# Patient Record
Sex: Female | Born: 1965
Health system: Southern US, Community
[De-identification: ages and names within clinical notes are randomized; demographics above are authoritative.]

## PROBLEM LIST (undated history)

## (undated) DIAGNOSIS — E039 Hypothyroidism, unspecified: Secondary | ICD-10-CM

## (undated) DIAGNOSIS — Z1151 Encounter for screening for human papillomavirus (HPV): Secondary | ICD-10-CM

## (undated) DIAGNOSIS — Z6832 Body mass index (BMI) 32.0-32.9, adult: Secondary | ICD-10-CM

## (undated) DIAGNOSIS — R4586 Emotional lability: Principal | ICD-10-CM

## (undated) DIAGNOSIS — Z1283 Encounter for screening for malignant neoplasm of skin: Secondary | ICD-10-CM

## (undated) DIAGNOSIS — E7800 Pure hypercholesterolemia, unspecified: Secondary | ICD-10-CM

---

## 2015-05-25 DIAGNOSIS — C50919 Malignant neoplasm of unspecified site of unspecified female breast: Secondary | ICD-10-CM

## 2015-05-25 HISTORY — PX: BREAST LUMPECTOMY: SHX2

## 2015-05-25 HISTORY — DX: Malignant neoplasm of unspecified site of unspecified female breast: C50.919

## 2019-05-15 DIAGNOSIS — U071 COVID-19: Secondary | ICD-10-CM | POA: Diagnosis not present

## 2019-05-23 ENCOUNTER — Emergency Department (HOSPITAL_COMMUNITY): Payer: BC Managed Care – PPO

## 2019-05-23 ENCOUNTER — Encounter (HOSPITAL_COMMUNITY): Payer: Self-pay | Admitting: Emergency Medicine

## 2019-05-23 ENCOUNTER — Inpatient Hospital Stay (HOSPITAL_COMMUNITY)
Admission: EM | Admit: 2019-05-23 | Discharge: 2019-05-30 | DRG: 177 | Disposition: A | Discharge status: Home / Self Care | Payer: BC Managed Care – PPO | Attending: Internal Medicine | Admitting: Internal Medicine

## 2019-05-23 ENCOUNTER — Other Ambulatory Visit: Payer: Self-pay

## 2019-05-23 DIAGNOSIS — F329 Major depressive disorder, single episode, unspecified: Secondary | ICD-10-CM | POA: Diagnosis present

## 2019-05-23 DIAGNOSIS — J1289 Other viral pneumonia: Secondary | ICD-10-CM | POA: Diagnosis not present

## 2019-05-23 DIAGNOSIS — U071 COVID-19: Secondary | ICD-10-CM | POA: Diagnosis not present

## 2019-05-23 DIAGNOSIS — E039 Hypothyroidism, unspecified: Secondary | ICD-10-CM | POA: Diagnosis present

## 2019-05-23 DIAGNOSIS — J1282 Pneumonia due to coronavirus disease 2019: Secondary | ICD-10-CM

## 2019-05-23 DIAGNOSIS — Z923 Personal history of irradiation: Secondary | ICD-10-CM

## 2019-05-23 DIAGNOSIS — J9600 Acute respiratory failure, unspecified whether with hypoxia or hypercapnia: Secondary | ICD-10-CM | POA: Diagnosis not present

## 2019-05-23 DIAGNOSIS — M5489 Other dorsalgia: Secondary | ICD-10-CM | POA: Diagnosis not present

## 2019-05-23 DIAGNOSIS — Z880 Allergy status to penicillin: Secondary | ICD-10-CM | POA: Diagnosis not present

## 2019-05-23 DIAGNOSIS — J984 Other disorders of lung: Secondary | ICD-10-CM | POA: Diagnosis not present

## 2019-05-23 DIAGNOSIS — N393 Stress incontinence (female) (male): Secondary | ICD-10-CM | POA: Diagnosis present

## 2019-05-23 DIAGNOSIS — R0602 Shortness of breath: Secondary | ICD-10-CM | POA: Diagnosis not present

## 2019-05-23 DIAGNOSIS — Z6827 Body mass index (BMI) 27.0-27.9, adult: Secondary | ICD-10-CM | POA: Diagnosis not present

## 2019-05-23 DIAGNOSIS — D649 Anemia, unspecified: Secondary | ICD-10-CM | POA: Diagnosis present

## 2019-05-23 DIAGNOSIS — Z882 Allergy status to sulfonamides status: Secondary | ICD-10-CM | POA: Diagnosis not present

## 2019-05-23 DIAGNOSIS — J9601 Acute respiratory failure with hypoxia: Secondary | ICD-10-CM | POA: Diagnosis not present

## 2019-05-23 DIAGNOSIS — Z853 Personal history of malignant neoplasm of breast: Secondary | ICD-10-CM

## 2019-05-23 DIAGNOSIS — Z79810 Long term (current) use of selective estrogen receptor modulators (SERMs): Secondary | ICD-10-CM | POA: Diagnosis not present

## 2019-05-23 DIAGNOSIS — R52 Pain, unspecified: Secondary | ICD-10-CM | POA: Diagnosis not present

## 2019-05-23 DIAGNOSIS — E669 Obesity, unspecified: Secondary | ICD-10-CM | POA: Diagnosis not present

## 2019-05-23 DIAGNOSIS — R0902 Hypoxemia: Secondary | ICD-10-CM | POA: Diagnosis not present

## 2019-05-23 DIAGNOSIS — E785 Hyperlipidemia, unspecified: Secondary | ICD-10-CM | POA: Diagnosis present

## 2019-05-23 DIAGNOSIS — Z79899 Other long term (current) drug therapy: Secondary | ICD-10-CM

## 2019-05-23 LAB — COMPREHENSIVE METABOLIC PANEL
ALT: 24 U/L (ref 0–44)
AST: 28 U/L (ref 15–41)
Albumin: 2.7 g/dL — ABNORMAL LOW (ref 3.5–5.0)
Alkaline Phosphatase: 54 U/L (ref 38–126)
Anion gap: 8 (ref 5–15)
BUN: 11 mg/dL (ref 6–20)
CO2: 27 mmol/L (ref 22–32)
Calcium: 8.6 mg/dL — ABNORMAL LOW (ref 8.9–10.3)
Chloride: 107 mmol/L (ref 98–111)
Creatinine, Ser: 0.79 mg/dL (ref 0.44–1.00)
GFR calc Af Amer: >60 mL/min (ref >60)
GFR calc non Af Amer: >60 mL/min (ref >60)
Glucose, Bld: 131 mg/dL — ABNORMAL HIGH (ref 70–99)
Potassium: 3.2 mmol/L — ABNORMAL LOW (ref 3.5–5.1)
Sodium: 142 mmol/L (ref 135–145)
Total Bilirubin: 0.3 mg/dL (ref 0.3–1.2)
Total Protein: 5.7 g/dL — ABNORMAL LOW (ref 6.5–8.1)

## 2019-05-23 LAB — CBC WITH DIFFERENTIAL/PLATELET
Abs Immature Granulocytes: 0.03 10*3/uL (ref 0.00–0.07)
Basophils Absolute: 0 10*3/uL (ref 0.0–0.1)
Basophils Relative: 0 %
Eosinophils Absolute: 0 10*3/uL (ref 0.0–0.5)
Eosinophils Relative: 0 %
HCT: 36.3 % (ref 36.0–46.0)
Hemoglobin: 11.9 g/dL — ABNORMAL LOW (ref 12.0–15.0)
Immature Granulocytes: 1 %
Lymphocytes Relative: 24 %
Lymphs Abs: 1.2 10*3/uL (ref 0.7–4.0)
MCH: 29.3 pg (ref 26.0–34.0)
MCHC: 32.8 g/dL (ref 30.0–36.0)
MCV: 89.4 fL (ref 80.0–100.0)
Monocytes Absolute: 0.2 10*3/uL (ref 0.1–1.0)
Monocytes Relative: 3 %
Neutro Abs: 3.6 10*3/uL (ref 1.7–7.7)
Neutrophils Relative %: 72 %
Platelets: 226 10*3/uL (ref 150–400)
RBC: 4.06 MIL/uL (ref 3.87–5.11)
RDW: 12.2 % (ref 11.5–15.5)
WBC: 5 10*3/uL (ref 4.0–10.5)
nRBC: 0 % (ref 0.0–0.2)

## 2019-05-23 LAB — C-REACTIVE PROTEIN: CRP: 18.6 mg/dL — ABNORMAL HIGH (ref <1.0)

## 2019-05-23 LAB — FERRITIN: Ferritin: 332 ng/mL — ABNORMAL HIGH (ref 11–307)

## 2019-05-23 LAB — LACTIC ACID, PLASMA: Lactic Acid, Venous: 1.3 mmol/L (ref 0.5–1.9)

## 2019-05-23 LAB — TRIGLYCERIDES: Triglycerides: 239 mg/dL — ABNORMAL HIGH (ref <150)

## 2019-05-23 LAB — FIBRINOGEN: Fibrinogen: 617 mg/dL — ABNORMAL HIGH (ref 210–475)

## 2019-05-23 LAB — D-DIMER, QUANTITATIVE: D-Dimer, Quant: 0.56 ug/mL-FEU — ABNORMAL HIGH (ref 0.00–0.50)

## 2019-05-23 LAB — LACTATE DEHYDROGENASE: LDH: 236 U/L — ABNORMAL HIGH (ref 98–192)

## 2019-05-23 MED ORDER — DEXAMETHASONE SODIUM PHOSPHATE 10 MG/ML IJ SOLN
6.0000 mg | Freq: Once | INTRAMUSCULAR | Status: AC
  Administered 2019-05-23: 6 mg via INTRAVENOUS
  Filled 2019-05-23: qty 1

## 2019-05-23 NOTE — ED Triage Notes (Signed)
Pt presents to ED from home BIB [facility]. Pt c/o being COVID pos 05/15/19 and no improvement of symptoms. Pt states she is still febrile and SOB with no exertion. Pt took 1g tylenol @1800 . Per EMS pt was 86% on RA upon arrival. O2 on 96% on 4L, 87% on RA. Pt in no resp distress during triage.

## 2019-05-23 NOTE — ED Notes (Signed)
Per Hal Hope, MD pt does not need repeat COVID swab. Positive COVID in Care Everywhere chart.

## 2019-05-23 NOTE — ED Provider Notes (Signed)
[facility] EMERGENCY DEPARTMENT Provider Note   CSN: [id number] Arrival date & time: 05/23/19  2130     History Chief Complaint  Patient presents with  . COVID  . Shortness of Breath    Nicole Stephens is a 53 y.o. female.  Patient presents to the emergency department with complaint of shortness of breath and hypoxia in setting of positive coronavirus testing.  Patient is on day 8 of her illness.  Initially she had body aches and fevers.  She was tested by her primary care doctor in [facility] and reports having a positive rapid test ([employer] 12/22).  She states that over the past 2 days her breathing has become worse and she gets short of breath with talking and with exertion.  EMS was called tonight after home pulse oximetry showed levels into the 70s.  Patient states that she continues to have fevers at home.  Using over-the-counter medications such as Tylenol.  Per EMS, patient was 86 on room air improved to 96% on 4 L.  Upon arrival to the emergency department she was 87% on a room air trial.  Patient denies any history of lung disease such as COPD, asthma, bronchitis.  She is not a smoker.  She does not know where she could have contracted coronavirus.  Family members at home have not yet developed any symptoms.  Onset of symptoms acute.  Course is worsening.          Past Medical History:  Diagnosis Date  . Breast cancer ([place]) 2017    There are no problems to display for this patient.   Past Surgical History:  Procedure Laterality Date  . BREAST LUMPECTOMY Right 2017     OB History   No obstetric history on file.     History reviewed. No pertinent family history.  Social History   Tobacco Use  . Smoking status: Never Smoker  . Smokeless tobacco: Never Used  Substance Use Topics  . Alcohol use: Never  . Drug use: Not on file    Home Medications Prior to Admission medications   Not on File    Allergies      Penicillins and Sulfa antibiotics  Review of Systems   Review of Systems  Constitutional: Positive for chills, fatigue and fever.  HENT: Negative for rhinorrhea and sore throat.   Eyes: Negative for redness.  Respiratory: Positive for cough and shortness of breath.   Cardiovascular: Negative for chest pain.  Gastrointestinal: Negative for abdominal pain, diarrhea, nausea and vomiting.  Genitourinary: Negative for dysuria.  Musculoskeletal: Positive for myalgias.  Skin: Negative for rash.  Neurological: Positive for weakness. Negative for headaches.    Physical Exam Updated Vital Signs BP 119/71   Pulse 76   Temp 99.1 F (37.3 C) (Oral)   Resp 18   Ht 5\' 10"  (1.778 m)   Wt 86.2 kg   SpO2 96%   BMI 27.26 kg/m   Physical Exam Vitals and nursing note reviewed.  Constitutional:      Appearance: She is well-developed.  HENT:     Head: Normocephalic and atraumatic.  Eyes:     General:        Right eye: No discharge.        Left eye: No discharge.     Conjunctiva/sclera: Conjunctivae normal.  Cardiovascular:     Rate and Rhythm: Normal rate and regular rhythm.     Heart sounds: Normal heart sounds.  Pulmonary:  Effort: Pulmonary effort is normal. Tachypnea present. No accessory muscle usage.     Breath sounds: Normal breath sounds. No decreased breath sounds, wheezing or rhonchi.  Abdominal:     Palpations: Abdomen is soft.     Tenderness: There is no abdominal tenderness.  Musculoskeletal:     Cervical back: Normal range of motion and neck supple.     Right lower leg: No tenderness. No edema.     Left lower leg: No tenderness. No edema.  Skin:    General: Skin is warm and dry.  Neurological:     Mental Status: She is alert.     ED Results / Procedures / Treatments   Labs (all labs ordered are listed, but only abnormal results are displayed) Labs Reviewed  CBC WITH DIFFERENTIAL/PLATELET - Abnormal; Notable for the following components:      Result Value    Hemoglobin 11.9 (*)    All other components within normal limits  COMPREHENSIVE METABOLIC PANEL - Abnormal; Notable for the following components:   Potassium 3.2 (*)    Glucose, Bld 131 (*)    Calcium 8.6 (*)    Total Protein 5.7 (*)    Albumin 2.7 (*)    All other components within normal limits  D-DIMER, QUANTITATIVE (NOT AT [facility]) - Abnormal; Notable for the following components:   D-Dimer, Quant 0.56 (*)    All other components within normal limits  LACTATE DEHYDROGENASE - Abnormal; Notable for the following components:   LDH 236 (*)    All other components within normal limits  FERRITIN - Abnormal; Notable for the following components:   Ferritin 332 (*)    All other components within normal limits  TRIGLYCERIDES - Abnormal; Notable for the following components:   Triglycerides 239 (*)    All other components within normal limits  FIBRINOGEN - Abnormal; Notable for the following components:   Fibrinogen 617 (*)    All other components within normal limits  C-REACTIVE PROTEIN - Abnormal; Notable for the following components:   CRP 18.6 (*)    All other components within normal limits  CULTURE, BLOOD (ROUTINE X 2)  CULTURE, BLOOD (ROUTINE X 2)  LACTIC ACID, PLASMA  LACTIC ACID, PLASMA  PROCALCITONIN    EKG EKG Interpretation  Date/Time:  Wednesday May 23 2019 21:33:23 EST Ventricular Rate:  75 PR Interval:    QRS Duration: 90 QT Interval:  391 QTC Calculation: 437 R Axis:   72 Text Interpretation: Sinus rhythm Borderline T wave abnormalities no prior available for comparison Confirmed by Quintella Reichert [phone]) on 05/23/2019 9:44:22 PM   Radiology DG Chest Port 1 View  Result Date: 05/23/2019 CLINICAL DATA:  Hypoxia.  COVID-19 positive. EXAM: PORTABLE CHEST 1 VIEW COMPARISON:  None. FINDINGS: Patchy bilateral airspace disease. Upper normal heart size with normal mediastinal contours. No pleural fluid or pneumothorax. No acute osseous abnormalities.  IMPRESSION: Patchy bilateral airspace disease, consistent with pneumonia, pattern typical of COVID-19. Electronically Signed   By: Keith Rake M.D.   On: 05/23/2019 23:29    Procedures Procedures (including critical care time)  Medications Ordered in ED Medications - No data to display  ED Course  I have reviewed the triage vital signs and the nursing notes.  Pertinent labs & imaging results that were available during my care of the patient were reviewed by me and considered in my medical decision making (see chart for details).  Patient seen and examined. Work-up initiated.  Patient will need admitted for respiratory failure  in setting of Covid pneumonia.  Will begin treatment with steroids.  Vital signs reviewed and are as follows: BP 119/71   Pulse 76   Temp 99.1 F (37.3 C) (Oral)   Resp 18   Ht 5\' 10"  (1.778 m)   Wt 86.2 kg   SpO2 96%   BMI 27.26 kg/m   11:09 PM X-ray images personally reviewed and interpreted.    Will call for admit.   Nicole Stephens was evaluated in Emergency Department on 05/23/2019 for the symptoms described in the history of present illness. She was evaluated in the context of the global COVID-19 pandemic, which necessitated consideration that the patient might be at risk for infection with the SARS-CoV-2 virus that causes COVID-19. Institutional protocols and algorithms that pertain to the evaluation of patients at risk for COVID-19 are in a state of rapid change based on information released by regulatory bodies including the CDC and federal and state organizations. These policies and algorithms were followed during the patient's care in the ED.  11:12 PM Spoke with Dr. Hal Hope who will see. No additional COVID test as + test in Care Everywhere.   CRITICAL CARE Performed by: Carlisle Cater PA-C Total critical care time: 30 minutes Critical care time was exclusive of separately billable procedures and treating other patients. Critical care was  necessary to treat or prevent imminent or life-threatening deterioration. Critical care was time spent personally by me on the following activities: development of treatment plan with patient and/or surrogate as well as nursing, discussions with consultants, evaluation of patient's response to treatment, examination of patient, obtaining history from patient or surrogate, ordering and performing treatments and interventions, ordering and review of laboratory studies, ordering and review of radiographic studies, pulse oximetry and re-evaluation of patient's condition.    MDM Rules/Calculators/A&P                      Admit for respiratory failure in setting of COVID-19 PNA, currently requiring 4L O2 [place].   Final Clinical Impression(s) / ED Diagnoses Final diagnoses:  Pneumonia due to COVID-19 virus  Acute respiratory failure with hypoxia [facility])    Rx / DC Orders ED Discharge Orders    None       Carlisle Cater, Hershal Coria 05/23/19 2333    Quintella Reichert, MD 05/24/19 [phone]

## 2019-05-24 ENCOUNTER — Other Ambulatory Visit: Payer: Self-pay

## 2019-05-24 ENCOUNTER — Encounter (HOSPITAL_COMMUNITY): Payer: Self-pay | Admitting: Internal Medicine

## 2019-05-24 DIAGNOSIS — Z882 Allergy status to sulfonamides status: Secondary | ICD-10-CM | POA: Diagnosis not present

## 2019-05-24 DIAGNOSIS — F329 Major depressive disorder, single episode, unspecified: Secondary | ICD-10-CM | POA: Diagnosis present

## 2019-05-24 DIAGNOSIS — Z880 Allergy status to penicillin: Secondary | ICD-10-CM | POA: Diagnosis not present

## 2019-05-24 DIAGNOSIS — N393 Stress incontinence (female) (male): Secondary | ICD-10-CM | POA: Diagnosis present

## 2019-05-24 DIAGNOSIS — J9601 Acute respiratory failure with hypoxia: Secondary | ICD-10-CM | POA: Diagnosis present

## 2019-05-24 DIAGNOSIS — U071 COVID-19: Principal | ICD-10-CM

## 2019-05-24 DIAGNOSIS — J1282 Pneumonia due to coronavirus disease 2019: Secondary | ICD-10-CM | POA: Diagnosis present

## 2019-05-24 DIAGNOSIS — E039 Hypothyroidism, unspecified: Secondary | ICD-10-CM | POA: Diagnosis present

## 2019-05-24 DIAGNOSIS — J9600 Acute respiratory failure, unspecified whether with hypoxia or hypercapnia: Secondary | ICD-10-CM | POA: Diagnosis not present

## 2019-05-24 DIAGNOSIS — J1289 Other viral pneumonia: Secondary | ICD-10-CM

## 2019-05-24 DIAGNOSIS — Z923 Personal history of irradiation: Secondary | ICD-10-CM | POA: Diagnosis not present

## 2019-05-24 DIAGNOSIS — Z853 Personal history of malignant neoplasm of breast: Secondary | ICD-10-CM | POA: Diagnosis not present

## 2019-05-24 DIAGNOSIS — E669 Obesity, unspecified: Secondary | ICD-10-CM | POA: Diagnosis present

## 2019-05-24 DIAGNOSIS — Z79899 Other long term (current) drug therapy: Secondary | ICD-10-CM | POA: Diagnosis not present

## 2019-05-24 DIAGNOSIS — R0602 Shortness of breath: Secondary | ICD-10-CM | POA: Diagnosis present

## 2019-05-24 DIAGNOSIS — E785 Hyperlipidemia, unspecified: Secondary | ICD-10-CM | POA: Diagnosis present

## 2019-05-24 DIAGNOSIS — Z79810 Long term (current) use of selective estrogen receptor modulators (SERMs): Secondary | ICD-10-CM | POA: Diagnosis not present

## 2019-05-24 DIAGNOSIS — D649 Anemia, unspecified: Secondary | ICD-10-CM | POA: Diagnosis present

## 2019-05-24 DIAGNOSIS — Z6827 Body mass index (BMI) 27.0-27.9, adult: Secondary | ICD-10-CM | POA: Diagnosis not present

## 2019-05-24 LAB — CBC WITH DIFFERENTIAL/PLATELET
Abs Immature Granulocytes: 0 10*3/uL (ref 0.00–0.07)
Basophils Absolute: 0 10*3/uL (ref 0.0–0.1)
Basophils Relative: 0 %
Eosinophils Absolute: 0 10*3/uL (ref 0.0–0.5)
Eosinophils Relative: 0 %
HCT: 36.8 % (ref 36.0–46.0)
Hemoglobin: 12.2 g/dL (ref 12.0–15.0)
Lymphocytes Relative: 6 %
Lymphs Abs: 0.3 10*3/uL — ABNORMAL LOW (ref 0.7–4.0)
MCH: 30 pg (ref 26.0–34.0)
MCHC: 33.2 g/dL (ref 30.0–36.0)
MCV: 90.6 fL (ref 80.0–100.0)
Monocytes Absolute: 0 10*3/uL — ABNORMAL LOW (ref 0.1–1.0)
Monocytes Relative: 0 %
Neutro Abs: 4.3 10*3/uL (ref 1.7–7.7)
Neutrophils Relative %: 94 %
Platelets: 246 10*3/uL (ref 150–400)
RBC: 4.06 MIL/uL (ref 3.87–5.11)
RDW: 12.3 % (ref 11.5–15.5)
WBC: 4.6 10*3/uL (ref 4.0–10.5)
nRBC: 0 % (ref 0.0–0.2)
nRBC: 0 /100 WBC

## 2019-05-24 LAB — C-REACTIVE PROTEIN: CRP: 15.3 mg/dL — ABNORMAL HIGH (ref <1.0)

## 2019-05-24 LAB — COMPREHENSIVE METABOLIC PANEL
ALT: 23 U/L (ref 0–44)
AST: 27 U/L (ref 15–41)
Albumin: 2.8 g/dL — ABNORMAL LOW (ref 3.5–5.0)
Alkaline Phosphatase: 54 U/L (ref 38–126)
Anion gap: 11 (ref 5–15)
BUN: 11 mg/dL (ref 6–20)
CO2: 25 mmol/L (ref 22–32)
Calcium: 8.5 mg/dL — ABNORMAL LOW (ref 8.9–10.3)
Chloride: 106 mmol/L (ref 98–111)
Creatinine, Ser: 0.62 mg/dL (ref 0.44–1.00)
GFR calc Af Amer: >60 mL/min (ref >60)
GFR calc non Af Amer: >60 mL/min (ref >60)
Glucose, Bld: 158 mg/dL — ABNORMAL HIGH (ref 70–99)
Potassium: 4 mmol/L (ref 3.5–5.1)
Sodium: 142 mmol/L (ref 135–145)
Total Bilirubin: <0.1 mg/dL — ABNORMAL LOW (ref 0.3–1.2)
Total Protein: 5.7 g/dL — ABNORMAL LOW (ref 6.5–8.1)

## 2019-05-24 LAB — CBC
HCT: 39.6 % (ref 36.0–46.0)
Hemoglobin: 12.7 g/dL (ref 12.0–15.0)
MCH: 29.4 pg (ref 26.0–34.0)
MCHC: 32.1 g/dL (ref 30.0–36.0)
MCV: 91.7 fL (ref 80.0–100.0)
Platelets: 256 10*3/uL (ref 150–400)
RBC: 4.32 MIL/uL (ref 3.87–5.11)
RDW: 12.3 % (ref 11.5–15.5)
WBC: 5.5 10*3/uL (ref 4.0–10.5)
nRBC: 0 % (ref 0.0–0.2)

## 2019-05-24 LAB — CREATININE, SERUM
Creatinine, Ser: 0.7 mg/dL (ref 0.44–1.00)
GFR calc Af Amer: >60 mL/min (ref >60)
GFR calc non Af Amer: >60 mL/min (ref >60)

## 2019-05-24 LAB — PROCALCITONIN: Procalcitonin: <0.1 ng/mL

## 2019-05-24 LAB — HIV ANTIBODY (ROUTINE TESTING W REFLEX): HIV Screen 4th Generation wRfx: NONREACTIVE

## 2019-05-24 LAB — FERRITIN: Ferritin: 330 ng/mL — ABNORMAL HIGH (ref 11–307)

## 2019-05-24 MED ORDER — POTASSIUM CHLORIDE CRYS ER 20 MEQ PO TBCR
40.0000 meq | EXTENDED_RELEASE_TABLET | Freq: Once | ORAL | Status: AC
  Administered 2019-05-24: 10:00:00 40 meq via ORAL
  Filled 2019-05-24: qty 2

## 2019-05-24 MED ORDER — ACETAMINOPHEN 325 MG PO TABS: 650.0000 mg | ORAL_TABLET | Freq: Four times a day (QID) | ORAL | Status: DC | PRN

## 2019-05-24 MED ORDER — ONDANSETRON HCL 4 MG/2ML IJ SOLN: 4.0000 mg | Freq: Four times a day (QID) | INTRAMUSCULAR | Status: DC | PRN

## 2019-05-24 MED ORDER — ONDANSETRON HCL 4 MG PO TABS: 4.0000 mg | ORAL_TABLET | Freq: Four times a day (QID) | ORAL | Status: DC | PRN

## 2019-05-24 MED ORDER — TAMOXIFEN CITRATE 10 MG PO TABS
20.0000 mg | ORAL_TABLET | Freq: Every day | ORAL | Status: DC
  Administered 2019-05-24: 10 mg via ORAL
  Administered 2019-05-25 – 2019-05-29 (×5): 20 mg via ORAL
  Filled 2019-05-24 (×8): qty 2

## 2019-05-24 MED ORDER — ACETAMINOPHEN 650 MG RE SUPP: 650.0000 mg | Freq: Four times a day (QID) | RECTAL | Status: DC | PRN

## 2019-05-24 MED ORDER — DEXTROMETHORPHAN POLISTIREX ER 30 MG/5ML PO SUER
30.0000 mg | Freq: Two times a day (BID) | ORAL | Status: DC
  Administered 2019-05-24 – 2019-05-26 (×5): 30 mg via ORAL
  Filled 2019-05-24 (×11): qty 5

## 2019-05-24 MED ORDER — ENOXAPARIN SODIUM 40 MG/0.4ML ~~LOC~~ SOLN
40.0000 mg | SUBCUTANEOUS | Status: DC
  Administered 2019-05-24 – 2019-05-30 (×7): 40 mg via SUBCUTANEOUS
  Filled 2019-05-24 (×7): qty 0.4

## 2019-05-24 MED ORDER — PAROXETINE HCL 20 MG PO TABS
40.0000 mg | ORAL_TABLET | Freq: Every day | ORAL | Status: DC
  Administered 2019-05-24 – 2019-05-30 (×7): 40 mg via ORAL
  Filled 2019-05-24 (×8): qty 2

## 2019-05-24 MED ORDER — DEXAMETHASONE SODIUM PHOSPHATE 10 MG/ML IJ SOLN
6.0000 mg | INTRAMUSCULAR | Status: DC
  Administered 2019-05-24 – 2019-05-26 (×3): 6 mg via INTRAVENOUS
  Filled 2019-05-24 (×3): qty 1

## 2019-05-24 MED ORDER — SODIUM CHLORIDE 0.9 % IV SOLN
200.0000 mg | Freq: Once | INTRAVENOUS | Status: AC
  Administered 2019-05-24: 200 mg via INTRAVENOUS
  Filled 2019-05-24 (×2): qty 40

## 2019-05-24 MED ORDER — LEVOTHYROXINE SODIUM 25 MCG PO TABS
125.0000 ug | ORAL_TABLET | Freq: Every day | ORAL | Status: DC
  Administered 2019-05-24 – 2019-05-30 (×7): 125 ug via ORAL
  Filled 2019-05-24 (×7): qty 1

## 2019-05-24 MED ORDER — SODIUM CHLORIDE 0.9 % IV SOLN
100.0000 mg | Freq: Every day | INTRAVENOUS | Status: AC
  Administered 2019-05-25 – 2019-05-28 (×4): 100 mg via INTRAVENOUS
  Filled 2019-05-24 (×5): qty 20

## 2019-05-24 NOTE — Progress Notes (Signed)
I agree with plan of care history physical as per my partner Dr. Hal Hope who admitted this patient this morning  52 year old white female known history of R breast cancer status post lumpectomy XRT 2018 [facility], stress incontinence, hyperlipidemia, mild obesity, hypothyroid, depression Diagnosed with coronavirus 19 04/25/2019 developed shortness of breath cough In the emergency room requiring 3 to 4 L of oxygen to maintain O2 sat  Ferritin 330,  CRP 18.6-->15.3  D-dimer 0.56  fibrinogen 617  lactic acid 1.3  procalcitonin less than 0.10  CXR = patchy bilateral airspace disease consistent with pneumonia pattern typical of coronavirus 19  /o/e BP 101/64   Pulse (!) 58   Temp 99.7 F (37.6 C) (Oral)   Resp 19   Ht 5\' 10"  (1.778 m)   Wt 86.2 kg   SpO2 95%   BMI 27.26 kg/m  Feels poorly Coughing ++ Some CP and sputum No fever Cannot go below 4 liters oxygen --desats to 88%   Assessment/plan Continue IV Decadron remdesivir cycle inflammatory markers We will need to complete therapy and treatment monitor respiratory status and trend down titrate oxygen if possible Watch closely and if worsening may need discussion about Actemra with Oncology if worsens  She understands might require more extended stay in hospital  Verneita Griffes, MD [facility] 9:57 AM

## 2019-05-24 NOTE — H&P (Signed)
History and Physical    Nicole Stephens [id number] DOB: 09/21/1965 DOA: 05/23/2019  PCP: Reita Cliche, MD  Patient coming from: Home.  Chief Complaint: Shortness of breath.  HPI: Nicole Stephens is a 53 y.o. female with history of breast cancer status post lumpectomy and radiation presently on tamoxifen, hypothyroidism, depression presents to the ER because of shortness of breath.  Patient was diagnosed with Covid infection on May 15, 2019 results of which are available in care everywhere.  At that time patient had generalized body ache and flulike symptoms.  Over the last 2 days patient symptoms got worse with nonproductive cough and shortness of breath.  Denies any chest pain nausea vomiting or diarrhea.  Had 1 episode of vomiting about 1 week ago.  Given the shortness of breath patient presents to the ER.  ED Course: In the ER patient was requiring at least 3 to 4 L oxygen to maintain sats.  Chest x-ray shows infiltrates concerning for pneumonia.  Patient had a fever of 99.4 F.  Labs show CRP of 18.6 ferritin 332 hemoglobin 1.9 WBC 5 potassium 3.2 albumin 2.7 EKG normal sinus rhythm.  Given the acute respiratory failure with hypoxia with chest x-ray showing bilateral infiltrates patient has been admitted for further management of acute respiratory failure with Covid pneumonia and started on IV Decadron and IV remdesivir.  Review of Systems: As per HPI, rest all negative.   Past Medical History:  Diagnosis Date  . Breast cancer ([place]) 2017    Past Surgical History:  Procedure Laterality Date  . BREAST LUMPECTOMY Right 2017     reports that she has never smoked. She has never used smokeless tobacco. She reports that she does not drink alcohol. No history on file for drug.  Allergies  Allergen Reactions  . Penicillins     Pt states she unsure of reaction  . Sulfa Antibiotics     Pt states she unsure of reaction    Family History  Family history unknown: Yes     Prior to Admission medications   Not on File    Physical Exam: Constitutional: Moderately built and nourished. Vitals:   05/24/19 0030 05/24/19 0045 05/24/19 0100 05/24/19 0115  BP: 109/66 109/68 110/68 108/67  Pulse: 64 62 62 64  Resp: 16 15 15 16   Temp:      TempSrc:      SpO2: 97% 98% 97% 97%  Weight:      Height:       Eyes: Anicteric no pallor. ENMT: No discharge from the ears eyes nose or mouth. Neck: No mass felt.  No neck rigidity. Respiratory: No rhonchi or crepitations. Cardiovascular: S1-S2 heard. Abdomen: Soft nontender bowel sounds present. Musculoskeletal: No edema.  No joint effusion. Skin: No rash. Neurologic: Alert awake oriented to time place and person.  Moves all extremities. Psychiatric: Appears normal per normal affect.   Labs on Admission: I have personally reviewed following labs and imaging studies  CBC: Recent Labs  Lab 05/23/19 2221  WBC 5.0  NEUTROABS 3.6  HGB 11.9*  HCT 36.3  MCV 89.4  PLT [id number]   Basic Metabolic Panel: Recent Labs  Lab 05/23/19 2221  NA 142  K 3.2*  CL 107  CO2 27  GLUCOSE 131*  BUN 11  CREATININE 0.79  CALCIUM 8.6*   GFR: Estimated Creatinine Clearance: 94.8 mL/min (by C-G formula based on SCr of 0.79 mg/dL). Liver Function Tests: Recent Labs  Lab 05/23/19 2221  AST 28  ALT 24  ALKPHOS 54  BILITOT 0.3  PROT 5.7*  ALBUMIN 2.7*   No results for input(s): LIPASE, AMYLASE in the last 168 hours. No results for input(s): AMMONIA in the last 168 hours. Coagulation Profile: No results for input(s): INR, PROTIME in the last 168 hours. Cardiac Enzymes: No results for input(s): CKTOTAL, CKMB, CKMBINDEX, TROPONINI in the last 168 hours. BNP (last 3 results) No results for input(s): PROBNP in the last 8760 hours. HbA1C: No results for input(s): HGBA1C in the last 72 hours. CBG: No results for input(s): GLUCAP in the last 168 hours. Lipid Profile: Recent Labs    05/23/19 2222  TRIG 239*    Thyroid Function Tests: No results for input(s): TSH, T4TOTAL, FREET4, T3FREE, THYROIDAB in the last 72 hours. Anemia Panel: Recent Labs    05/23/19 2221  FERRITIN 332*   Urine analysis: No results found for: COLORURINE, APPEARANCEUR, LABSPEC, PHURINE, GLUCOSEU, HGBUR, BILIRUBINUR, KETONESUR, PROTEINUR, UROBILINOGEN, NITRITE, LEUKOCYTESUR Sepsis Labs: @LABRCNTIP (procalcitonin:4,lacticidven:4) )No results found for this or any previous visit (from the past 240 hour(s)).   Radiological Exams on Admission: DG Chest Port 1 View  Result Date: 05/23/2019 CLINICAL DATA:  Hypoxia.  COVID-19 positive. EXAM: PORTABLE CHEST 1 VIEW COMPARISON:  None. FINDINGS: Patchy bilateral airspace disease. Upper normal heart size with normal mediastinal contours. No pleural fluid or pneumothorax. No acute osseous abnormalities. IMPRESSION: Patchy bilateral airspace disease, consistent with pneumonia, pattern typical of COVID-19. Electronically Signed   By: Keith Rake M.D.   On: 05/23/2019 23:29    EKG: Independently reviewed.  Normal sinus rhythm.  Assessment/Plan Active Problems:   Acute respiratory failure due to COVID-19 [facility])   Hypothyroidism    1. Acute respiratory failure with hypoxia secondary to COVID-19 pneumonia for which I have started patient on IV Decadron and remdesivir.  Closely follow respiratory status and inflammatory markers.  Given that patient has history of breast cancer and is not sure if it is cured I have not started patient on Actemra. 2. History of breast cancer on tamoxifen dose has to be verified. 3. History of hypothyroidism on Synthroid. 4. History of depression on paroxetine. 5. Normocytic normochromic anemia appears to be new.  Follow CBC. 6. Low albumin levels noted.  Will need further work-up.  Since patient presents with the acute respiratory failure hypoxia and Covid infection will need inpatient status.   DVT prophylaxis: Lovenox. Code Status: Full  code. Family Communication: Discussed with patient. Disposition Plan: Home. Consults called: None. Admission status: Inpatient.   Rise Patience MD [facility] Pager [phone].  If 7PM-7AM, please contact night-coverage [email or web address] Password [email or web address]  05/24/2019, 1:35 AM

## 2019-05-24 NOTE — ED Notes (Signed)
Lunch Tray Ordered @ 1108.  

## 2019-05-24 NOTE — ED Notes (Signed)
Pt insisted on using the bedside commode versus bedpan to have a bowel movement. Upon minimal exertion O2 sats dropped to 83% on 6L, patient proceeded to have a coughing spell with difficulty catching her breath, labored respirations, and HR up to 150. Patient placed back into the stretcher and placed on a NRB temporarily for sats to improve. After a few moments, sats improved and NRB was removed. Discussed with patient that in the future she will need to use a bedpan to avoid exertion. Pt voices understanding and will cooperate.

## 2019-05-24 NOTE — ED Notes (Signed)
+  tele  Breakfast ordered 

## 2019-05-25 DIAGNOSIS — J1282 Pneumonia due to coronavirus disease 2019: Secondary | ICD-10-CM

## 2019-05-25 LAB — CBC WITH DIFFERENTIAL/PLATELET
Abs Immature Granulocytes: 0.03 10*3/uL (ref 0.00–0.07)
Basophils Absolute: 0 10*3/uL (ref 0.0–0.1)
Basophils Relative: 0 %
Eosinophils Absolute: 0 10*3/uL (ref 0.0–0.5)
Eosinophils Relative: 0 %
HCT: 37.8 % (ref 36.0–46.0)
Hemoglobin: 12.3 g/dL (ref 12.0–15.0)
Immature Granulocytes: 1 %
Lymphocytes Relative: 20 %
Lymphs Abs: 0.9 10*3/uL (ref 0.7–4.0)
MCH: 29.4 pg (ref 26.0–34.0)
MCHC: 32.5 g/dL (ref 30.0–36.0)
MCV: 90.4 fL (ref 80.0–100.0)
Monocytes Absolute: 0.1 10*3/uL (ref 0.1–1.0)
Monocytes Relative: 3 %
Neutro Abs: 3.4 10*3/uL (ref 1.7–7.7)
Neutrophils Relative %: 76 %
Platelets: 333 10*3/uL (ref 150–400)
RBC: 4.18 MIL/uL (ref 3.87–5.11)
RDW: 12.4 % (ref 11.5–15.5)
WBC: 4.5 10*3/uL (ref 4.0–10.5)
nRBC: 0 % (ref 0.0–0.2)

## 2019-05-25 LAB — FERRITIN: Ferritin: 347 ng/mL — ABNORMAL HIGH (ref 11–307)

## 2019-05-25 LAB — COMPREHENSIVE METABOLIC PANEL
ALT: 27 U/L (ref 0–44)
AST: 26 U/L (ref 15–41)
Albumin: 3 g/dL — ABNORMAL LOW (ref 3.5–5.0)
Alkaline Phosphatase: 62 U/L (ref 38–126)
Anion gap: 12 (ref 5–15)
BUN: 14 mg/dL (ref 6–20)
CO2: 25 mmol/L (ref 22–32)
Calcium: 9.3 mg/dL (ref 8.9–10.3)
Chloride: 104 mmol/L (ref 98–111)
Creatinine, Ser: 0.67 mg/dL (ref 0.44–1.00)
GFR calc Af Amer: >60 mL/min (ref >60)
GFR calc non Af Amer: >60 mL/min (ref >60)
Glucose, Bld: 143 mg/dL — ABNORMAL HIGH (ref 70–99)
Potassium: 4.3 mmol/L (ref 3.5–5.1)
Sodium: 141 mmol/L (ref 135–145)
Total Bilirubin: 0.2 mg/dL — ABNORMAL LOW (ref 0.3–1.2)
Total Protein: 6.3 g/dL — ABNORMAL LOW (ref 6.5–8.1)

## 2019-05-25 LAB — D-DIMER, QUANTITATIVE: D-Dimer, Quant: 0.46 ug/mL-FEU (ref 0.00–0.50)

## 2019-05-25 LAB — PROCALCITONIN: Procalcitonin: <0.1 ng/mL

## 2019-05-25 LAB — C-REACTIVE PROTEIN: CRP: 8.4 mg/dL — ABNORMAL HIGH (ref <1.0)

## 2019-05-25 MED ORDER — GUAIFENESIN-DM 100-10 MG/5ML PO SYRP
5.0000 mL | ORAL_SOLUTION | ORAL | Status: DC | PRN
  Administered 2019-05-25 – 2019-05-26 (×3): 5 mL via ORAL
  Filled 2019-05-25 (×4): qty 5

## 2019-05-25 MED ORDER — SODIUM CHLORIDE 0.9 % IV SOLN: INTRAVENOUS | Status: DC | PRN

## 2019-05-25 NOTE — Progress Notes (Signed)
Hospitalist daily note   Felicitas Holder [id number] DOB: 07-24-1965 DOA: 05/23/2019  PCP: Reita Cliche, MD   Narrative:  54 year old white female known history of R breast cancer status post lumpectomy XRT 2018 [facility], stress incontinence, hyperlipidemia, mild obesity, hypothyroid, depression Diagnosed with coronavirus 19 04/25/2019 developed shortness of breath cough In the emergency room requiring 3 to 4 L of oxygen to maintain O2 sat  Data Reviewed:  Ferritin 330-->347  CRP 18.6-->15.3-->8.4 D-dimer 0.56-->0.46  procalcitonin less than 0.10 CXR = patchy bilateral airspace disease consistent with pneumonia pattern typical of coronavirus 19  BUN/creatinine 11/0.6, potassium 4.0 Assessment & Plan: Coronavirus 19 Inflammatory markers improving  Still requiring oxygen and desats easily requiring upto 15 liters at time when ambulatory continue Decadron through 06/02/2019 Continue remdesivir through 05/28/2019   Right breast cancer status post lumpectomy XRT 2018 Continue tamoxifen 20 daily Hypothyroidism Continue levothyroxine 125 daiy outpatient TSH 3 weeks Depression Continue Paxil 40 daily Hyperlipidemia, obesity control for  Medical issues  Subjective: Awake alert feels better coughing a lot States that the cough syrup helped significantly No cp no fever  Consultants:   None at this time Procedures:   None Antimicrobials:   Remdesivir   Objective: Vitals:   05/25/19 0600 05/25/19 0615 05/25/19 0630 05/25/19 0645  BP: 119/68  126/67   Pulse: (!) 59 (!) 55 62 (!) 57  Resp: (!) 21 (!) 22 19 (!) 22  Temp:      TempSrc:      SpO2: 94% 95% 94% 90%  Weight:      Height:        Intake/Output Summary (Last 24 hours) at 05/25/2019 0817 Last data filed at 05/24/2019 1440 Gross per 24 hour  Intake --  Output 1700 ml  Net -1700 ml   Filed Weights   05/23/19 2136  Weight: 86.2 kg    Examination: Awake alert coherent no distress EOMI NCAT no focal deficit Chest  clinically clear no added sound no rales no rhonchi poor air entry Abdomen soft no rebound no guarding Neurologically intact no focal deficit Skin soft supple no lower extremity edema 35  Scheduled Meds: . dexamethasone (DECADRON) injection  6 mg Intravenous Q24H  . dextromethorphan  30 mg Oral BID  . enoxaparin (LOVENOX) injection  40 mg Subcutaneous Q24H  . levothyroxine  125 mcg Oral Q0600  . PARoxetine  40 mg Oral Daily  . tamoxifen  20 mg Oral QHS   Continuous Infusions: . remdesivir 100 mg in NS 100 mL       LOS: 1 day   Time spent: [place], MD [facility]

## 2019-05-26 LAB — CBC WITH DIFFERENTIAL/PLATELET
Abs Immature Granulocytes: 0.1 10*3/uL — ABNORMAL HIGH (ref 0.00–0.07)
Basophils Absolute: 0 10*3/uL (ref 0.0–0.1)
Basophils Relative: 0 %
Eosinophils Absolute: 0 10*3/uL (ref 0.0–0.5)
Eosinophils Relative: 0 %
HCT: 38.4 % (ref 36.0–46.0)
Hemoglobin: 12.5 g/dL (ref 12.0–15.0)
Immature Granulocytes: 2 %
Lymphocytes Relative: 28 %
Lymphs Abs: 1.3 10*3/uL (ref 0.7–4.0)
MCH: 29.4 pg (ref 26.0–34.0)
MCHC: 32.6 g/dL (ref 30.0–36.0)
MCV: 90.4 fL (ref 80.0–100.0)
Monocytes Absolute: 0.2 10*3/uL (ref 0.1–1.0)
Monocytes Relative: 5 %
Neutro Abs: 3 10*3/uL (ref 1.7–7.7)
Neutrophils Relative %: 65 %
Platelets: 345 10*3/uL (ref 150–400)
RBC: 4.25 MIL/uL (ref 3.87–5.11)
RDW: 12.1 % (ref 11.5–15.5)
WBC: 4.6 10*3/uL (ref 4.0–10.5)
nRBC: 0 % (ref 0.0–0.2)

## 2019-05-26 LAB — COMPREHENSIVE METABOLIC PANEL
ALT: 32 U/L (ref 0–44)
AST: 30 U/L (ref 15–41)
Albumin: 3.1 g/dL — ABNORMAL LOW (ref 3.5–5.0)
Alkaline Phosphatase: 52 U/L (ref 38–126)
Anion gap: 13 (ref 5–15)
BUN: 17 mg/dL (ref 6–20)
CO2: 22 mmol/L (ref 22–32)
Calcium: 8.8 mg/dL — ABNORMAL LOW (ref 8.9–10.3)
Chloride: 105 mmol/L (ref 98–111)
Creatinine, Ser: 0.62 mg/dL (ref 0.44–1.00)
GFR calc Af Amer: >60 mL/min (ref >60)
GFR calc non Af Amer: >60 mL/min (ref >60)
Glucose, Bld: 94 mg/dL (ref 70–99)
Potassium: 4.1 mmol/L (ref 3.5–5.1)
Sodium: 140 mmol/L (ref 135–145)
Total Bilirubin: 0.5 mg/dL (ref 0.3–1.2)
Total Protein: 6.2 g/dL — ABNORMAL LOW (ref 6.5–8.1)

## 2019-05-26 LAB — FERRITIN: Ferritin: 290 ng/mL (ref 11–307)

## 2019-05-26 LAB — D-DIMER, QUANTITATIVE: D-Dimer, Quant: 0.37 ug/mL-FEU (ref 0.00–0.50)

## 2019-05-26 LAB — C-REACTIVE PROTEIN: CRP: 3.3 mg/dL — ABNORMAL HIGH (ref <1.0)

## 2019-05-26 LAB — PROCALCITONIN: Procalcitonin: <0.1 ng/mL

## 2019-05-26 MED ORDER — ORAL CARE MOUTH RINSE
15.0000 mL | Freq: Two times a day (BID) | OROMUCOSAL | Status: DC
  Administered 2019-05-26 – 2019-05-30 (×8): 15 mL via OROMUCOSAL

## 2019-05-26 NOTE — Progress Notes (Signed)
Hospitalist daily note   Nicole Stephens [id number] DOB: 1966/04/11 DOA: 05/23/2019  PCP: Reita Cliche, MD   Narrative:  54 year old white female known history of R breast cancer status post lumpectomy XRT 2018 [facility], stress incontinence, hyperlipidemia, mild obesity, hypothyroid, depression Diagnosed with coronavirus 19 04/25/2019 developed shortness of breath cough In the emergency room requiring 3 to 4 L of oxygen to maintain O2 sat  Data Reviewed:  Ferritin 330-->347-->290  CRP 18.6-->15.3-->8.4-->3.3 D-dimer 0.56-->0.46-->0.37  procalcitonin less than 0.10 CXR = patchy bilateral airspace disease consistent with pneumonia pattern typical of coronavirus 19  BUN/creatinine 11/0.6, potassium 4.0 Assessment & Plan: Coronavirus 19 Inflammatory markers improving well Requires now 4 liters down from 15 but overall is improving--encouraged ambulation continue Decadron through 06/02/2019 Continue remdesivir through 05/28/2019   Right breast cancer status post lumpectomy XRT 2018 Continue tamoxifen 20 daily Hypothyroidism Continue levothyroxine 125 daiy outpatient TSH 3 weeks Depression Continue Paxil 40 daily Hyperlipidemia, obesity control for  lovenox prophy, no family inpatient till 1/4, full code  Subjective: Awake alertless cough no sputum no cp States that the cough syrup helped significantly No cp no fever  Consultants:   None at this time Procedures:   None Antimicrobials:   Remdesivir   Objective: Vitals:   05/26/19 0200 05/26/19 0400 05/26/19 0519 05/26/19 0520  BP:    127/81  Pulse: 65 (!) 54 64 82  Resp: (!) 21 (!) 21 20 20   Temp:   98.7 F (37.1 C)   TempSrc:   Oral   SpO2: 94% 94% 90% 92%  Weight:      Height:        Intake/Output Summary (Last 24 hours) at 05/26/2019 0748 Last data filed at 05/25/2019 2150 Gross per 24 hour  Intake 700 ml  Output --  Net 700 ml   Filed Weights   05/23/19 2136  Weight: 86.2 kg    Examination:  EOMI NCAT no  focal deficit Chest clinically clear no added sound no rales no rhonchi Abdomen soft no rebound no guarding Neurologically intact no focal deficit Skin soft  Scheduled Meds: . dexamethasone (DECADRON) injection  6 mg Intravenous Q24H  . dextromethorphan  30 mg Oral BID  . enoxaparin (LOVENOX) injection  40 mg Subcutaneous Q24H  . levothyroxine  125 mcg Oral Q0600  . mouth rinse  15 mL Mouth Rinse BID  . PARoxetine  40 mg Oral Daily  . tamoxifen  20 mg Oral QHS   Continuous Infusions: . sodium chloride    . remdesivir 100 mg in NS 100 mL Stopped (05/25/19 1900)     LOS: 2 days   Time spent: [place], MD [facility]

## 2019-05-27 LAB — COMPREHENSIVE METABOLIC PANEL
ALT: 32 U/L (ref 0–44)
AST: 23 U/L (ref 15–41)
Albumin: 2.8 g/dL — ABNORMAL LOW (ref 3.5–5.0)
Alkaline Phosphatase: 58 U/L (ref 38–126)
Anion gap: 12 (ref 5–15)
BUN: 21 mg/dL — ABNORMAL HIGH (ref 6–20)
CO2: 22 mmol/L (ref 22–32)
Calcium: 8.8 mg/dL — ABNORMAL LOW (ref 8.9–10.3)
Chloride: 104 mmol/L (ref 98–111)
Creatinine, Ser: 0.71 mg/dL (ref 0.44–1.00)
GFR calc Af Amer: >60 mL/min (ref >60)
GFR calc non Af Amer: >60 mL/min (ref >60)
Glucose, Bld: 141 mg/dL — ABNORMAL HIGH (ref 70–99)
Potassium: 3.3 mmol/L — ABNORMAL LOW (ref 3.5–5.1)
Sodium: 138 mmol/L (ref 135–145)
Total Bilirubin: 0.3 mg/dL (ref 0.3–1.2)
Total Protein: 5.6 g/dL — ABNORMAL LOW (ref 6.5–8.1)

## 2019-05-27 LAB — CBC WITH DIFFERENTIAL/PLATELET
Abs Immature Granulocytes: 0.22 10*3/uL — ABNORMAL HIGH (ref 0.00–0.07)
Basophils Absolute: 0 10*3/uL (ref 0.0–0.1)
Basophils Relative: 0 %
Eosinophils Absolute: 0 10*3/uL (ref 0.0–0.5)
Eosinophils Relative: 0 %
HCT: 36 % (ref 36.0–46.0)
Hemoglobin: 12.1 g/dL (ref 12.0–15.0)
Immature Granulocytes: 4 %
Lymphocytes Relative: 31 %
Lymphs Abs: 1.6 10*3/uL (ref 0.7–4.0)
MCH: 29.8 pg (ref 26.0–34.0)
MCHC: 33.6 g/dL (ref 30.0–36.0)
MCV: 88.7 fL (ref 80.0–100.0)
Monocytes Absolute: 0.4 10*3/uL (ref 0.1–1.0)
Monocytes Relative: 8 %
Neutro Abs: 3 10*3/uL (ref 1.7–7.7)
Neutrophils Relative %: 57 %
Platelets: 425 10*3/uL — ABNORMAL HIGH (ref 150–400)
RBC: 4.06 MIL/uL (ref 3.87–5.11)
RDW: 12 % (ref 11.5–15.5)
WBC: 5.3 10*3/uL (ref 4.0–10.5)
nRBC: 0 % (ref 0.0–0.2)

## 2019-05-27 LAB — C-REACTIVE PROTEIN: CRP: 2 mg/dL — ABNORMAL HIGH (ref <1.0)

## 2019-05-27 LAB — D-DIMER, QUANTITATIVE: D-Dimer, Quant: 0.29 ug/mL-FEU (ref 0.00–0.50)

## 2019-05-27 LAB — FERRITIN: Ferritin: 234 ng/mL (ref 11–307)

## 2019-05-27 MED ORDER — DEXAMETHASONE 6 MG PO TABS
6.0000 mg | ORAL_TABLET | Freq: Every day | ORAL | Status: DC
  Administered 2019-05-27 – 2019-05-30 (×4): 6 mg via ORAL
  Filled 2019-05-27 (×4): qty 1

## 2019-05-27 MED ORDER — POTASSIUM CHLORIDE CRYS ER 20 MEQ PO TBCR
40.0000 meq | EXTENDED_RELEASE_TABLET | Freq: Every day | ORAL | Status: DC
  Administered 2019-05-27 – 2019-05-29 (×3): 40 meq via ORAL
  Filled 2019-05-27 (×4): qty 2

## 2019-05-27 MED ORDER — GUAIFENESIN-DM 100-10 MG/5ML PO SYRP
5.0000 mL | ORAL_SOLUTION | ORAL | Status: DC
  Administered 2019-05-27 – 2019-05-30 (×15): 5 mL via ORAL
  Filled 2019-05-27 (×19): qty 5

## 2019-05-27 NOTE — Progress Notes (Signed)
Hospitalist daily note   Nicole Stephens [id number] DOB: 08/09/1965 DOA: 05/23/2019  PCP: Reita Cliche, MD   Narrative:  54 year old white female known history of R breast cancer status post lumpectomy XRT 2018 [facility], stress incontinence, hyperlipidemia, mild obesity, hypothyroid, depression Diagnosed with coronavirus 19 04/25/2019 developed shortness of breath cough In the emergency room requiring 3 to 4 L of oxygen to maintain O2 sat  Data Reviewed:  Ferritin 330-->347-->290->234  CRP 18.6-->15.3-->8.4-->3.3->2.0 D-dimer 0.56-->0.46-->0.37->0.29  procalcitonin less than 0.10 CXR = patchy bilateral airspace disease consistent with pneumonia pattern typical of coronavirus 19  BUN/creatinine 11/0.6, potassium 4.0 Assessment & Plan: Coronavirus 19 Inflammatory markers improving well Requires now 4 liters down from 15 but overall is improving--encouraged ambulation-needs desat screen and patient aware ot walk around continue Decadron through 06/02/2019, change to po on 1/3 Continue remdesivir through 05/28/2019   Right breast cancer status post lumpectomy XRT 2018 Continue tamoxifen 20 daily Hypothyroidism Continue levothyroxine 125 daiy outpatient TSH 3 weeks Depression Continue Paxil 40 daily Hyperlipidemia, obesity control for  lovenox prophy, no family inpatient till 1/4, full code  Subjective:  Fair no distress.  Some cough didn't get cough syrup earlier No cp  Consultants:   None at this time Procedures:   None Antimicrobials:   Remdesivir   Objective: Vitals:   05/26/19 0600 05/26/19 2136 05/27/19 0513 05/27/19 0540  BP:  122/73 133/75   Pulse: (!) 53 75 90 (!) 58  Resp: 17 (!) 25 (!) 23 20  Temp:  99.3 F (37.4 C)    TempSrc:  Oral Oral   SpO2: 96% 94% (!) 89% 92%  Weight:      Height:        Intake/Output Summary (Last 24 hours) at 05/27/2019 0857 Last data filed at 05/27/2019 0519 Gross per 24 hour  Intake --  Output 2 ml  Net -2 ml   Filed Weights    05/23/19 2136  Weight: 86.2 kg    Examination: Awake coherett in nad- on 4 L nasal canula now Chest clinically clear no added sound no rales no rhonchi Abdomen soft no rebound no guarding Neurologically intact no focal deficit Skin soft  Scheduled Meds: . dexamethasone (DECADRON) injection  6 mg Intravenous Q24H  . enoxaparin (LOVENOX) injection  40 mg Subcutaneous Q24H  . guaiFENesin-dextromethorphan  5 mL Oral Q4H  . levothyroxine  125 mcg Oral Q0600  . mouth rinse  15 mL Mouth Rinse BID  . PARoxetine  40 mg Oral Daily  . potassium chloride  40 mEq Oral Daily  . tamoxifen  20 mg Oral QHS   Continuous Infusions: . sodium chloride    . remdesivir 100 mg in NS 100 mL 100 mg (05/26/19 1118)     LOS: 3 days   Time spent: [place], MD [facility]

## 2019-05-27 NOTE — Progress Notes (Signed)
The patient was ambulated in the hallways. She sat at the side of the bed without oxygen and dropped down to 82%. She was then stood up and went down to 80%. We had her sit back down, applied oxygen and it brought her back up to 95%. She was then walked around the unit with 4L of  oxygen and remained around 88-90%.

## 2019-05-28 LAB — CBC WITH DIFFERENTIAL/PLATELET
Abs Immature Granulocytes: 0.45 10*3/uL — ABNORMAL HIGH (ref 0.00–0.07)
Basophils Absolute: 0 10*3/uL (ref 0.0–0.1)
Basophils Relative: 1 %
Eosinophils Absolute: 0 10*3/uL (ref 0.0–0.5)
Eosinophils Relative: 0 %
HCT: 39.2 % (ref 36.0–46.0)
Hemoglobin: 12.8 g/dL (ref 12.0–15.0)
Immature Granulocytes: 7 %
Lymphocytes Relative: 19 %
Lymphs Abs: 1.3 10*3/uL (ref 0.7–4.0)
MCH: 29.2 pg (ref 26.0–34.0)
MCHC: 32.7 g/dL (ref 30.0–36.0)
MCV: 89.3 fL (ref 80.0–100.0)
Monocytes Absolute: 0.3 10*3/uL (ref 0.1–1.0)
Monocytes Relative: 5 %
Neutro Abs: 4.5 10*3/uL (ref 1.7–7.7)
Neutrophils Relative %: 68 %
Platelets: 478 10*3/uL — ABNORMAL HIGH (ref 150–400)
RBC: 4.39 MIL/uL (ref 3.87–5.11)
RDW: 11.9 % (ref 11.5–15.5)
WBC: 6.6 10*3/uL (ref 4.0–10.5)
nRBC: 0 % (ref 0.0–0.2)

## 2019-05-28 LAB — COMPREHENSIVE METABOLIC PANEL
ALT: 31 U/L (ref 0–44)
AST: 21 U/L (ref 15–41)
Albumin: 3.1 g/dL — ABNORMAL LOW (ref 3.5–5.0)
Alkaline Phosphatase: 60 U/L (ref 38–126)
Anion gap: 11 (ref 5–15)
BUN: 18 mg/dL (ref 6–20)
CO2: 24 mmol/L (ref 22–32)
Calcium: 9.2 mg/dL (ref 8.9–10.3)
Chloride: 104 mmol/L (ref 98–111)
Creatinine, Ser: 0.84 mg/dL (ref 0.44–1.00)
GFR calc Af Amer: >60 mL/min (ref >60)
GFR calc non Af Amer: >60 mL/min (ref >60)
Glucose, Bld: 115 mg/dL — ABNORMAL HIGH (ref 70–99)
Potassium: 4.3 mmol/L (ref 3.5–5.1)
Sodium: 139 mmol/L (ref 135–145)
Total Bilirubin: 0.4 mg/dL (ref 0.3–1.2)
Total Protein: 6.5 g/dL (ref 6.5–8.1)

## 2019-05-28 LAB — D-DIMER, QUANTITATIVE: D-Dimer, Quant: 0.32 ug/mL-FEU (ref 0.00–0.50)

## 2019-05-28 LAB — CULTURE, BLOOD (ROUTINE X 2)
Culture: NO GROWTH
Culture: NO GROWTH
Special Requests: ADEQUATE
Special Requests: ADEQUATE

## 2019-05-28 LAB — FERRITIN: Ferritin: 229 ng/mL (ref 11–307)

## 2019-05-28 LAB — C-REACTIVE PROTEIN: CRP: 2 mg/dL — ABNORMAL HIGH (ref <1.0)

## 2019-05-28 LAB — BRAIN NATRIURETIC PEPTIDE: B Natriuretic Peptide: 25.5 pg/mL (ref 0.0–100.0)

## 2019-05-28 MED ORDER — LEVALBUTEROL TARTRATE 45 MCG/ACT IN AERO
2.0000 | INHALATION_SPRAY | Freq: Three times a day (TID) | RESPIRATORY_TRACT | Status: DC
  Administered 2019-05-28 – 2019-05-30 (×6): 2 via RESPIRATORY_TRACT
  Filled 2019-05-28: qty 15

## 2019-05-28 MED ORDER — IPRATROPIUM BROMIDE HFA 17 MCG/ACT IN AERS
2.0000 | INHALATION_SPRAY | Freq: Three times a day (TID) | RESPIRATORY_TRACT | Status: DC
  Administered 2019-05-28 – 2019-05-30 (×6): 2 via RESPIRATORY_TRACT
  Filled 2019-05-28: qty 12.9

## 2019-05-28 MED ORDER — ASCORBIC ACID 500 MG PO TABS
500.0000 mg | ORAL_TABLET | Freq: Every day | ORAL | Status: DC
  Administered 2019-05-28 – 2019-05-30 (×3): 500 mg via ORAL
  Filled 2019-05-28 (×3): qty 1

## 2019-05-28 MED ORDER — VITAMIN D 25 MCG (1000 UNIT) PO TABS
1000.0000 [IU] | ORAL_TABLET | Freq: Every day | ORAL | Status: DC
  Administered 2019-05-28 – 2019-05-30 (×3): 1000 [IU] via ORAL
  Filled 2019-05-28 (×2): qty 1

## 2019-05-28 MED ORDER — FUROSEMIDE 10 MG/ML IJ SOLN
20.0000 mg | Freq: Two times a day (BID) | INTRAMUSCULAR | Status: DC
  Administered 2019-05-28 – 2019-05-29 (×4): 20 mg via INTRAVENOUS
  Filled 2019-05-28 (×4): qty 2

## 2019-05-28 MED ORDER — HYDROCOD POLST-CPM POLST ER 10-8 MG/5ML PO SUER
5.0000 mL | Freq: Two times a day (BID) | ORAL | Status: DC
  Administered 2019-05-28 – 2019-05-30 (×5): 5 mL via ORAL
  Filled 2019-05-28 (×5): qty 5

## 2019-05-28 MED ORDER — ZINC SULFATE 220 (50 ZN) MG PO CAPS
220.0000 mg | ORAL_CAPSULE | Freq: Every day | ORAL | Status: DC
  Administered 2019-05-28 – 2019-05-30 (×3): 220 mg via ORAL
  Filled 2019-05-28 (×3): qty 1

## 2019-05-28 NOTE — Progress Notes (Signed)
PROGRESS NOTE  Nicole Stephens [id number] DOB: 1966/05/12 DOA: 05/23/2019 PCP: Reita Cliche, MD  HPI/Recap of past 6 hours: 54 year old white female known history of R breast cancer status post lumpectomy XRT 2018 [facility], stress incontinence, hyperlipidemia, mild obesity, hypothyroid, depression who presented from home with complaints of shortness of breath and nonproductive cough.  COVID-19 screening test positive on 05/15/2019 per the patient.  Hypoxic on presentation.  Not on oxygen supplementation at baseline.  Currently on 4 L nasal cannula to maintain O2 saturation greater than 90%.  05/28/19: Seen and examined reports persistent nonproductive cough and dyspnea with minimal ambulation.  BNP ordered.  Started scheduled bronchodilators, judicious doses of Lasix, Tussionex for pleuritic pain, incentive spirometer and flutter valve.  We will repeat home O2 evaluation tomorrow 05/29/2019 for DC planning.  Appreciate case manager's assistance with DME oxygen.   Assessment/Plan: Active Problems:   Acute respiratory failure due to COVID-19 [facility])   Hypothyroidism   COVID-19 viral pneumonia Reported positive COVID-19 test on 05/15/2019 Independently reviewed chest x-ray done on admission which showed bilateral infiltrates Currently requiring 4 L to maintain O2 saturation greater than 92% Will complete 5 days of remdesivir on 05/28/2019. Also on Decadron Added bronchodilators Xopenex inhaler 2 puffs 3 times daily and ipratropium inhaler 2 puffs 3 times daily Added Tussionex for pleuritic pain twice daily x3 days Added vitamin D3, vitamin C and zinc daily Obtained BNP Added IV Lasix 20 mg twice daily Added incentive spirometer every 1 hour while awake Also added flutter valve every 4 hours while awake Repeat home O2 evaluation on 05/29/2019 for DC planning Continue to maintain O2 saturation greater than 92% Inflammatory markers are trending down, continue to trend.  History of right  breast cancer status post lumpectomy XRT in 2018 Continue tamoxifen Will need to follow-up with her provider outpatient  Hypothyroidism Continue levothyroxine  Chronic depression Continue home medications     Code Status: Full code  Family Communication: None at bedside.  Disposition Plan: Home possibly tomorrow 05/29/2019.  Consultants:  None  Procedures:  None  Antimicrobials:  None  DVT prophylaxis: Subcu Lovenox daily.   Objective: Vitals:   05/27/19 1217 05/27/19 2123 05/28/19 0553 05/28/19 1254  BP: 110/71 121/74 124/73 105/77  Pulse: 64 77 68 88  Resp: 20 18 17  (!) 25  Temp: 98.6 F (37 C) 98.2 F (36.8 C) 98 F (36.7 C) 98 F (36.7 C)  TempSrc: Oral Oral Oral Oral  SpO2: 95% 93% 94% 94%  Weight:      Height:        Intake/Output Summary (Last 24 hours) at 05/28/2019 1355 Last data filed at 05/28/2019 0857 Gross per 24 hour  Intake 590 ml  Output --  Net 590 ml   Filed Weights   05/23/19 2136  Weight: 86.2 kg    Exam:  . General: 54 y.o. year-old female well developed well nourished in no acute distress.  Alert and oriented x3. . Cardiovascular: Regular rate and rhythm with no rubs or gallops.  No thyromegaly or JVD noted.   Marland Kitchen Respiratory: Mild rales at bases no wheezing noted.  Poor inspiratory effort.  . Abdomen: Soft nontender nondistended with normal bowel sounds x4 quadrants. . Musculoskeletal: Trace lower extremity edema. 2/4 pulses in all 4 extremities. Marland Kitchen Psychiatry: Mood is appropriate for condition and setting   Data Reviewed: CBC: Recent Labs  Lab 05/24/19 0541 05/25/19 0416 05/26/19 0500 05/27/19 0931 05/28/19 0408  WBC 4.6 4.5 4.6 5.3 6.6  NEUTROABS 4.3  3.4 3.0 3.0 4.5  HGB 12.2 12.3 12.5 12.1 12.8  HCT 36.8 37.8 38.4 36.0 39.2  MCV 90.6 90.4 90.4 88.7 89.3  PLT 246 333 345 425* [id number]*   Basic Metabolic Panel: Recent Labs  Lab 05/24/19 0541 05/25/19 0416 05/26/19 0500 05/27/19 0931 05/28/19 0408  NA 142 141  140 138 139  K 4.0 4.3 4.1 3.3* 4.3  CL 106 104 105 104 104  CO2 25 25 22 22 24   GLUCOSE 158* 143* 94 141* 115*  BUN 11 14 17  21* 18  CREATININE 0.62 0.67 0.62 0.71 0.84  CALCIUM 8.5* 9.3 8.8* 8.8* 9.2   GFR: Estimated Creatinine Clearance: 90.3 mL/min (by C-G formula based on SCr of 0.84 mg/dL). Liver Function Tests: Recent Labs  Lab 05/24/19 0541 05/25/19 0416 05/26/19 0500 05/27/19 0931 05/28/19 0408  AST 27 26 30 23 21   ALT 23 27 32 32 31  ALKPHOS 54 62 52 58 60  BILITOT <0.1* 0.2* 0.5 0.3 0.4  PROT 5.7* 6.3* 6.2* 5.6* 6.5  ALBUMIN 2.8* 3.0* 3.1* 2.8* 3.1*   No results for input(s): LIPASE, AMYLASE in the last 168 hours. No results for input(s): AMMONIA in the last 168 hours. Coagulation Profile: No results for input(s): INR, PROTIME in the last 168 hours. Cardiac Enzymes: No results for input(s): CKTOTAL, CKMB, CKMBINDEX, TROPONINI in the last 168 hours. BNP (last 3 results) No results for input(s): PROBNP in the last 8760 hours. HbA1C: No results for input(s): HGBA1C in the last 72 hours. CBG: No results for input(s): GLUCAP in the last 168 hours. Lipid Profile: No results for input(s): CHOL, HDL, LDLCALC, TRIG, CHOLHDL, LDLDIRECT in the last 72 hours. Thyroid Function Tests: No results for input(s): TSH, T4TOTAL, FREET4, T3FREE, THYROIDAB in the last 72 hours. Anemia Panel: Recent Labs    05/27/19 0931 05/28/19 0408  FERRITIN 234 229   Urine analysis: No results found for: COLORURINE, APPEARANCEUR, LABSPEC, PHURINE, GLUCOSEU, HGBUR, BILIRUBINUR, KETONESUR, PROTEINUR, UROBILINOGEN, NITRITE, LEUKOCYTESUR Sepsis Labs: @LABRCNTIP (procalcitonin:4,lacticidven:4)  ) Recent Results (from the past 240 hour(s))  Blood Culture (routine x 2)     Status: None (Preliminary result)   Collection Time: 05/23/19 10:02 PM   Specimen: BLOOD LEFT HAND  Result Value Ref Range Status   Specimen Description BLOOD LEFT HAND  Final   Special Requests   Final    BOTTLES  DRAWN AEROBIC AND ANAEROBIC Blood Culture adequate volume   Culture   Final    NO GROWTH 4 DAYS Performed at [facility], [place] [address] [place], [place] [address]    Report Status PENDING  Incomplete  Blood Culture (routine x 2)     Status: None (Preliminary result)   Collection Time: 05/23/19 10:33 PM   Specimen: BLOOD  Result Value Ref Range Status   Specimen Description BLOOD LEFT ANTECUBITAL  Final   Special Requests   Final    BOTTLES DRAWN AEROBIC AND ANAEROBIC Blood Culture adequate volume   Culture   Final    NO GROWTH 4 DAYS Performed at [facility], [place] [address], [place], [place] [address]    Report Status PENDING  Incomplete      Studies: No results found.  Scheduled Meds: . vitamin C  500 mg Oral Daily  . chlorpheniramine-HYDROcodone  5 mL Oral Q12H  . cholecalciferol  1,000 Units Oral Daily  . dexamethasone  6 mg Oral Daily  . enoxaparin (LOVENOX) injection  40 mg Subcutaneous Q24H  . furosemide  20  mg Intravenous Q12H  . guaiFENesin-dextromethorphan  5 mL Oral Q4H  . ipratropium  2 puff Inhalation Q8H  . levalbuterol  2 puff Inhalation Q8H  . levothyroxine  125 mcg Oral Q0600  . mouth rinse  15 mL Mouth Rinse BID  . PARoxetine  40 mg Oral Daily  . potassium chloride  40 mEq Oral Daily  . tamoxifen  20 mg Oral QHS  . zinc sulfate  220 mg Oral Daily    Continuous Infusions: . sodium chloride       LOS: 4 days     Kayleen Memos, MD [facility] Pager [phone]  If 7PM-7AM, please contact night-coverage [email or web address] Password [email or web address] 05/28/2019, 1:55 PM

## 2019-05-28 NOTE — TOC Initial Note (Signed)
Transition of Care [facility]) - Initial/Assessment Note    Patient Details  Name: Nicole Stephens MRN: [id number] Date of Birth: 1965-11-18  [facility]) CM/SW Contact:    Maryclare Labrador, RN Phone Number: 05/28/2019, 4:38 PM  Clinical Narrative:   PTA independent from home with relatives.  Pt confirms she has PCP and denied barriers with paying discharge medications.  Pt is interested in home oxygen - choice given - [employer] chosen - referral accept                Expected Discharge Plan: Home/Self Care Barriers to Discharge: Continued Medical Work up   Patient Goals and CMS Choice Patient states their goals for this hospitalization and ongoing recovery are:: Pt did not state specific goal post discharge CMS Medicare.gov Compare Post Acute Care list provided to:: Patient Choice offered to / list presented to : Patient  Expected Discharge Plan and Services Expected Discharge Plan: Home/Self Care       Living arrangements for the past 2 months: Single Family Home                 DME Arranged: Oxygen DME Agency: [employer] Date DME Agency Contacted: 05/28/19 Time DME Agency Contacted: 1200 Representative spoke with at DME Agency: [email or web address]            Prior Living Arrangements/Services Living arrangements for the past 2 months: [place] with:: Relatives Patient language and need for interpreter reviewed:: Yes Do you feel safe going back to the place where you live?: Yes      Need for Family Participation in Patient Care: Yes (Comment) Care giver support system in place?: Yes (comment)   Criminal Activity/Legal Involvement Pertinent to Current Situation/Hospitalization: No - Comment as needed  Activities of Daily Living Home Assistive Devices/Equipment: Eyeglasses ADL Screening (condition at time of admission) Patient's cognitive ability adequate to safely complete daily activities?: Yes Is the patient deaf or have difficulty hearing?: No Does the  patient have difficulty seeing, even when wearing glasses/contacts?: No Does the patient have difficulty concentrating, remembering, or making decisions?: No Patient able to express need for assistance with ADLs?: Yes Does the patient have difficulty dressing or bathing?: No Independently performs ADLs?: Yes (appropriate for developmental age) Does the patient have difficulty walking or climbing stairs?: Yes Weakness of Legs: Both Weakness of Arms/Hands: None  Permission Sought/Granted         Permission granted to share info w AGENCY: [facility]        Emotional Assessment   Attitude/Demeanor/Rapport: Charismatic, Self-Confident, Engaged Affect (typically observed): Accepting Orientation: : Oriented to Self, Oriented to Place, Oriented to  Time, Oriented to Situation   Psych Involvement: No (comment)  Admission diagnosis:  Acute respiratory failure with hypoxia (HCC) [J96.01] Acute respiratory failure due to COVID-19 ([place]) [U07.1, J96.00] Pneumonia due to COVID-19 virus [U07.1, J12.82] Patient Active Problem List   Diagnosis Date Noted  . Acute respiratory failure due to COVID-19 ([place]) 05/24/2019  . Hypothyroidism 05/24/2019   PCP:  Reita Cliche, MD Pharmacy:   [id number] - [place], [place] [address] Phone: [phone] Fax: [phone]     Social Determinants of Health (SDOH) Interventions    Readmission Risk Interventions No flowsheet data found.

## 2019-05-28 NOTE — Plan of Care (Signed)
  Problem: Education: Goal: Knowledge of risk factors and measures for prevention of condition will improve Outcome: Progressing   Problem: Coping: Goal: Psychosocial and spiritual needs will be supported Outcome: Progressing   Problem: Respiratory: Goal: Will maintain a patent airway Outcome: Progressing   

## 2019-05-29 LAB — COMPREHENSIVE METABOLIC PANEL
ALT: 27 U/L (ref 0–44)
AST: 19 U/L (ref 15–41)
Albumin: 3.4 g/dL — ABNORMAL LOW (ref 3.5–5.0)
Alkaline Phosphatase: 64 U/L (ref 38–126)
Anion gap: 11 (ref 5–15)
BUN: 19 mg/dL (ref 6–20)
CO2: 28 mmol/L (ref 22–32)
Calcium: 9.2 mg/dL (ref 8.9–10.3)
Chloride: 100 mmol/L (ref 98–111)
Creatinine, Ser: 0.83 mg/dL (ref 0.44–1.00)
GFR calc Af Amer: >60 mL/min (ref >60)
GFR calc non Af Amer: >60 mL/min (ref >60)
Glucose, Bld: 119 mg/dL — ABNORMAL HIGH (ref 70–99)
Potassium: 4.2 mmol/L (ref 3.5–5.1)
Sodium: 139 mmol/L (ref 135–145)
Total Bilirubin: 0.3 mg/dL (ref 0.3–1.2)
Total Protein: 6.7 g/dL (ref 6.5–8.1)

## 2019-05-29 LAB — FIBRINOGEN: Fibrinogen: 514 mg/dL — ABNORMAL HIGH (ref 210–475)

## 2019-05-29 LAB — C-REACTIVE PROTEIN: CRP: 2.4 mg/dL — ABNORMAL HIGH (ref <1.0)

## 2019-05-29 LAB — FERRITIN: Ferritin: 238 ng/mL (ref 11–307)

## 2019-05-29 LAB — D-DIMER, QUANTITATIVE: D-Dimer, Quant: <0.27 ug/mL-FEU (ref 0.00–0.50)

## 2019-05-29 LAB — LACTATE DEHYDROGENASE: LDH: 202 U/L — ABNORMAL HIGH (ref 98–192)

## 2019-05-29 MED ORDER — POLYETHYLENE GLYCOL 3350 17 G PO PACK
17.0000 g | PACK | Freq: Every day | ORAL | Status: DC
  Filled 2019-05-29 (×2): qty 1

## 2019-05-29 MED ORDER — SENNOSIDES-DOCUSATE SODIUM 8.6-50 MG PO TABS
2.0000 | ORAL_TABLET | Freq: Two times a day (BID) | ORAL | Status: DC
  Administered 2019-05-29 – 2019-05-30 (×2): 2 via ORAL
  Filled 2019-05-29 (×3): qty 2

## 2019-05-29 NOTE — Progress Notes (Signed)
PROGRESS NOTE  Nicole Stephens [id number] DOB: [birth year] DOA: 05/23/2019 PCP: Reita Cliche, MD  HPI/Recap of past 56 hours: 54 year old white female known history of R breast cancer status post lumpectomy XRT 2018 [facility], stress incontinence, hyperlipidemia, mild obesity, hypothyroid, depression who presented from home with complaints of shortness of breath and nonproductive cough.  COVID-19 screening test positive on 05/15/2019 per the patient.  Hypoxic on presentation.  Not on oxygen supplementation at baseline.  Improved with diuresis and bronchodilators.  05/29/19: Seen and examined.  Symptoms are improving however will need additional diuresing with tentative discharge 05/30/2019.   Assessment/Plan: Active Problems:   Acute respiratory failure due to COVID-19 [facility])   Hypothyroidism   COVID-19 viral pneumonia in the setting of immunosuppression with ongoing breast cancer chemotherapy Reported positive COVID-19 test on 05/15/2019 Independently reviewed chest x-ray done on admission which showed bilateral infiltrates Completed 5 days of remdesivir on 05/28/2019. On Decadron. Hypoxia improved on diuresing and bronchodilators. Continue bronchodilators Xopenex inhaler 2 puffs 3 times daily and ipratropium inhaler 2 puffs 3 times daily Continue Tussionex for pleuritic pain twice daily x3 days Continue vitamin D3, vitamin C and zinc daily Continue IV Lasix 20 mg twice daily Continue incentive spirometer every 1 hour while awake Continue flutter valve every 4 hours while awake Inflammatory markers are trending down, continue to trend.  Transient tachycardia likely related to acute lung illness Continue to treat underlying condition  History of right breast cancer status post lumpectomy XRT in 2018 Continue tamoxifen Will need to follow-up with her oncologist outpatient.  Hypothyroidism Continue levothyroxine  Chronic depression Continue home medications     Code Status:  Full code  Family Communication: None at bedside.  Disposition Plan: Anticipate discharge to home tomorrow on 05/30/2019.  Consultants:  None  Procedures:  None  Antimicrobials:  None  DVT prophylaxis: Subcu Lovenox daily.   Objective: Vitals:   05/29/19 0824 05/29/19 0825 05/29/19 1115 05/29/19 1239  BP:      Pulse:  84 97 75  Resp:  17 (!) 21 16  Temp:      TempSrc:      SpO2: (!) 89% 92% 90% 93%  Weight:      Height:        Intake/Output Summary (Last 24 hours) at 05/29/2019 1422 Last data filed at 05/29/2019 1036 Gross per 24 hour  Intake --  Output 1 ml  Net -1 ml   Filed Weights   05/23/19 2136  Weight: 86.2 kg    Exam:  . General: 54 y.o. year-old female well-developed well-nourished no acute distress.  Alert and oriented x3.   . Cardiovascular: Regular rate and rhythm no rubs or gallops. Marland Kitchen Respiratory: Mild rales at bases.  No wheezing noted. . Abdomen: Soft nontender normal bowel sounds present.   . Musculoskeletal: Trace lower extremity edema bilaterally. Marland Kitchen Psychiatry: Mood is appropriate for condition and setting.   Data Reviewed: CBC: Recent Labs  Lab 05/24/19 0541 05/25/19 0416 05/26/19 0500 05/27/19 0931 05/28/19 0408  WBC 4.6 4.5 4.6 5.3 6.6  NEUTROABS 4.3 3.4 3.0 3.0 4.5  HGB 12.2 12.3 12.5 12.1 12.8  HCT 36.8 37.8 38.4 36.0 39.2  MCV 90.6 90.4 90.4 88.7 89.3  PLT 246 333 345 425* [id number]*   Basic Metabolic Panel: Recent Labs  Lab 05/25/19 0416 05/26/19 0500 05/27/19 0931 05/28/19 0408 05/29/19 0327  NA 141 140 138 139 139  K 4.3 4.1 3.3* 4.3 4.2  CL 104 105 104 104 100  CO2 25  22 22 24 28   GLUCOSE 143* 94 141* 115* 119*  BUN 14 17 21* 18 19  CREATININE 0.67 0.62 0.71 0.84 0.83  CALCIUM 9.3 8.8* 8.8* 9.2 9.2   GFR: Estimated Creatinine Clearance: 90.3 mL/min (by C-G formula based on SCr of 0.83 mg/dL). Liver Function Tests: Recent Labs  Lab 05/25/19 0416 05/26/19 0500 05/27/19 0931 05/28/19 0408 05/29/19 0327   AST 26 30 23 21 19   ALT 27 32 32 31 27  ALKPHOS 62 52 58 60 64  BILITOT 0.2* 0.5 0.3 0.4 0.3  PROT 6.3* 6.2* 5.6* 6.5 6.7  ALBUMIN 3.0* 3.1* 2.8* 3.1* 3.4*   No results for input(s): LIPASE, AMYLASE in the last 168 hours. No results for input(s): AMMONIA in the last 168 hours. Coagulation Profile: No results for input(s): INR, PROTIME in the last 168 hours. Cardiac Enzymes: No results for input(s): CKTOTAL, CKMB, CKMBINDEX, TROPONINI in the last 168 hours. BNP (last 3 results) No results for input(s): PROBNP in the last 8760 hours. HbA1C: No results for input(s): HGBA1C in the last 72 hours. CBG: No results for input(s): GLUCAP in the last 168 hours. Lipid Profile: No results for input(s): CHOL, HDL, LDLCALC, TRIG, CHOLHDL, LDLDIRECT in the last 72 hours. Thyroid Function Tests: No results for input(s): TSH, T4TOTAL, FREET4, T3FREE, THYROIDAB in the last 72 hours. Anemia Panel: Recent Labs    05/28/19 0408 05/29/19 0327  FERRITIN 229 238   Urine analysis: No results found for: COLORURINE, APPEARANCEUR, LABSPEC, PHURINE, GLUCOSEU, HGBUR, BILIRUBINUR, KETONESUR, PROTEINUR, UROBILINOGEN, NITRITE, LEUKOCYTESUR Sepsis Labs: @LABRCNTIP (procalcitonin:4,lacticidven:4)  ) Recent Results (from the past 240 hour(s))  Blood Culture (routine x 2)     Status: None   Collection Time: 05/23/19 10:02 PM   Specimen: BLOOD LEFT HAND  Result Value Ref Range Status   Specimen Description BLOOD LEFT HAND  Final   Special Requests   Final    BOTTLES DRAWN AEROBIC AND ANAEROBIC Blood Culture adequate volume   Culture   Final    NO GROWTH 5 DAYS Performed at [facility], [address], [place], [place] [address]    Report Status 05/28/2019 FINAL  Final  Blood Culture (routine x 2)     Status: None   Collection Time: 05/23/19 10:33 PM   Specimen: BLOOD  Result Value Ref Range Status   Specimen Description BLOOD LEFT ANTECUBITAL  Final   Special Requests   Final    BOTTLES DRAWN  AEROBIC AND ANAEROBIC Blood Culture adequate volume   Culture   Final    NO GROWTH 5 DAYS Performed at [facility], [place] [address] [place], [place] [address]    Report Status 05/28/2019 FINAL  Final      Studies: No results found.  Scheduled Meds: . vitamin C  500 mg Oral Daily  . chlorpheniramine-HYDROcodone  5 mL Oral Q12H  . cholecalciferol  1,000 Units Oral Daily  . dexamethasone  6 mg Oral Daily  . enoxaparin (LOVENOX) injection  40 mg Subcutaneous Q24H  . furosemide  20 mg Intravenous Q12H  . guaiFENesin-dextromethorphan  5 mL Oral Q4H  . ipratropium  2 puff Inhalation Q8H  . levalbuterol  2 puff Inhalation Q8H  . levothyroxine  125 mcg Oral Q0600  . mouth rinse  15 mL Mouth Rinse BID  . PARoxetine  40 mg Oral Daily  . polyethylene glycol  17 g Oral Daily  . potassium chloride  40 mEq Oral Daily  . senna-docusate  2 tablet  Oral BID  . tamoxifen  20 mg Oral QHS  . zinc sulfate  220 mg Oral Daily    Continuous Infusions: . sodium chloride       LOS: 5 days     Kayleen Memos, MD [facility] Pager [phone]  If 7PM-7AM, please contact night-coverage [email or web address] Password [email or web address] 05/29/2019, 2:22 PM

## 2019-05-29 NOTE — Plan of Care (Signed)
  Problem: Education: Goal: Knowledge of risk factors and measures for prevention of condition will improve Outcome: Progressing   Problem: Coping: Goal: Psychosocial and spiritual needs will be supported Outcome: Progressing   Problem: Respiratory: Goal: Will maintain a patent airway Outcome: Progressing Goal: Complications related to the disease process, condition or treatment will be avoided or minimized Outcome: Progressing   Problem: Education: Goal: Knowledge of General Education information will improve Description: Including pain rating scale, medication(s)/side effects and non-pharmacologic comfort measures Outcome: Progressing   Problem: Health Behavior/Discharge Planning: Goal: Ability to manage health-related needs will improve Outcome: Progressing   Problem: Clinical Measurements: Goal: Ability to maintain clinical measurements within normal limits will improve Outcome: Progressing Goal: Will remain free from infection Outcome: Progressing Goal: Diagnostic test results will improve Outcome: Progressing Goal: Respiratory complications will improve Outcome: Progressing Goal: Cardiovascular complication will be avoided Outcome: Progressing   Problem: Activity: Goal: Risk for activity intolerance will decrease Outcome: Progressing   Problem: Nutrition: Goal: Adequate nutrition will be maintained Outcome: Progressing   Problem: Coping: Goal: Level of anxiety will decrease Outcome: Progressing   Problem: Elimination: Goal: Will not experience complications related to bowel motility Outcome: Progressing Goal: Will not experience complications related to urinary retention Outcome: Progressing   Problem: Pain Managment: Goal: General experience of comfort will improve Outcome: Progressing   Problem: Safety: Goal: Ability to remain free from injury will improve Outcome: Progressing   Problem: Skin Integrity: Goal: Risk for impaired skin integrity will  decrease Outcome: Progressing   Problem: Education: Goal: Knowledge of General Education information will improve Description: Including pain rating scale, medication(s)/side effects and non-pharmacologic comfort measures Outcome: Progressing   Problem: Health Behavior/Discharge Planning: Goal: Ability to manage health-related needs will improve Outcome: Progressing   Problem: Clinical Measurements: Goal: Ability to maintain clinical measurements within normal limits will improve Outcome: Progressing Goal: Will remain free from infection Outcome: Progressing Goal: Diagnostic test results will improve Outcome: Progressing Goal: Respiratory complications will improve Outcome: Progressing Goal: Cardiovascular complication will be avoided Outcome: Progressing   Problem: Activity: Goal: Risk for activity intolerance will decrease Outcome: Progressing   Problem: Nutrition: Goal: Adequate nutrition will be maintained Outcome: Progressing   Problem: Coping: Goal: Level of anxiety will decrease Outcome: Progressing   Problem: Elimination: Goal: Will not experience complications related to bowel motility Outcome: Progressing Goal: Will not experience complications related to urinary retention Outcome: Progressing   Problem: Pain Managment: Goal: General experience of comfort will improve Outcome: Progressing   Problem: Safety: Goal: Ability to remain free from injury will improve Outcome: Progressing   Problem: Skin Integrity: Goal: Risk for impaired skin integrity will decrease Outcome: Progressing   

## 2019-05-29 NOTE — Progress Notes (Signed)
SATURATION QUALIFICATIONS: (This note is used to comply with regulatory documentation for home oxygen)  Patient Saturations on Room Air at Rest =  92%  Patient Saturations on Room Air while Ambulating =  90%  Patient Saturations on 2 Liters of oxygen while Ambulating = 95%  Please briefly explain why patient needs home oxygen: No complains of SOB while ambulating on room air

## 2019-05-29 NOTE — Progress Notes (Signed)
   Vital Signs MEWS/VS Documentation      05/28/2019 1254 05/28/2019 1900 05/28/2019 1945 05/29/2019 0014   MEWS Score:  1  1  1   0   MEWS Score Color:  [name]  Green  Green  Green   Resp:  (!) 25  -  -  17   Pulse:  88  -  -  69   BP:  105/77  -  -  (!) 136/91   Temp:  98 F (36.7 C)  -  -  98.6 F (37 C)   O2 Device:  -  -  -  Nasal Cannula   O2 Flow Rate (L/min):  -  -  -  4 L/min   Level of Consciousness:  -  -  Alert  Alert           Nicole Stephens 05/29/2019,1:32 AM

## 2019-05-29 NOTE — Evaluation (Signed)
Physical Therapy Evaluation Patient Details Name: Nicole Stephens MRN: [id number] DOB: [birth year] Today's Date: 05/29/2019   History of Present Illness  54 year old white female known history of R breast cancer status post lumpectomy XRT 2018 [facility], stress incontinence, hyperlipidemia, mild obesity, hypothyroid, depression who presented from home with complaints of shortness of breath and nonproductive cough.  COVID-19 screening test positive on 05/15/2019 per the patient.  Hypoxic on presentation.   Clinical Impression  PTA pt living with husband and 57 year old daughter in multistory home with 5 steps to enter. Pt was independent in mobility and iADLs, drives and walks community distances. Pt is limited in safe mobility by oxygen desaturation and generalized weakness. Pt is independent with bed mobility, transfers and mod I for ambulation of 200 feet on 2 L O2. Pt O2 drops to 88%O2 with ambulation however pt able to recover to mid 90%s O2 with pursed lipped breathing. Pt educated on energy conservation. PT will follow back tomorrow for stair training and HEP. No additional PT services are anticipated at discharge.      Follow Up Recommendations No PT follow up;Supervision for mobility/OOB    Equipment Recommendations  None recommended by PT       Precautions / Restrictions Precautions Precautions: None Restrictions Weight Bearing Restrictions: No      Mobility  Bed Mobility Overal bed mobility: Independent                Transfers Overall transfer level: Independent Equipment used: None             General transfer comment: good power up and steadying in standing   Ambulation/Gait Ambulation/Gait assistance: Modified independent (Device/Increase time) Gait Distance (Feet): 200 Feet Assistive device: None Gait Pattern/deviations: WFL(Within Functional Limits) Gait velocity: decreased from pt baseline Gait velocity interpretation: >2.62 ft/sec, indicative of community  ambulatory General Gait Details: slow, steady gait, 3/4 DoE by end of ambulation         Balance Overall balance assessment: Modified Independent                                           Pertinent Vitals/Pain Pain Assessment: No/denies pain    Home Living Family/patient expects to be discharged to:: Private residence Living Arrangements: Spouse/significant other;Other relatives Available Help at Discharge: Family Type of Home: House Home Access: Stairs to enter Entrance Stairs-Rails: None Entrance Stairs-Number of Steps: 5 Home Layout: Multi-level Home Equipment: None      Prior Function Level of Independence: Independent                  Extremity/Trunk Assessment   Upper Extremity Assessment Upper Extremity Assessment: Overall WFL for tasks assessed    Lower Extremity Assessment Lower Extremity Assessment: Overall WFL for tasks assessed    Cervical / Trunk Assessment Cervical / Trunk Assessment: Normal  Communication   Communication: No difficulties  Cognition Arousal/Alertness: Awake/alert Behavior During Therapy: WFL for tasks assessed/performed Overall Cognitive Status: Within Functional Limits for tasks assessed                                        General Comments General comments (skin integrity, edema, etc.): SaO2 88%O2 on 2L with 200 feet ambulation, easily able to recover to low 90s with pursed lipped breathing.  Assessment/Plan    PT Assessment Patient needs continued PT services  PT Problem List Decreased strength;Decreased activity tolerance;Decreased mobility;Cardiopulmonary status limiting activity       PT Treatment Interventions DME instruction;Gait training;Stair training;Functional mobility training;Therapeutic activities;Therapeutic exercise;Balance training;Cognitive remediation;Patient/family education    PT Goals (Current goals can be found in the Care Plan section)  Acute Rehab  PT Goals Patient Stated Goal: go home  PT Goal Formulation: With patient Time For Goal Achievement: 06/12/19 Potential to Achieve Goals: Good    Frequency Min 3X/week    AM-PAC PT "6 Clicks" Mobility  Outcome Measure Help needed turning from your back to your side while in a flat bed without using bedrails?: None Help needed moving from lying on your back to sitting on the side of a flat bed without using bedrails?: None Help needed moving to and from a bed to a chair (including a wheelchair)?: None Help needed standing up from a chair using your arms (e.g., wheelchair or bedside chair)?: None Help needed to walk in hospital room?: None Help needed climbing 3-5 steps with a railing? : A Little 6 Click Score: 23    End of Session Equipment Utilized During Treatment: Gait belt;Oxygen Activity Tolerance: Patient tolerated treatment well Patient left: in chair;with call bell/phone within reach Nurse Communication: Mobility status PT Visit Diagnosis: Muscle weakness (generalized) (M62.81);Difficulty in walking, not elsewhere classified (R26.2)    Time: [id number] PT Time Calculation (min) (ACUTE ONLY): 41 min   Charges:   PT Evaluation $PT Eval Low Complexity: 1 Low PT Treatments $Gait Training: 8-22 mins $Therapeutic Activity: 8-22 mins        Tamme Mozingo B. Migdalia Dk PT, DPT Acute Rehabilitation Services Pager [phone] Office [phone]   [place] 05/29/2019, 4:21 PM

## 2019-05-30 MED ORDER — ASCORBIC ACID 500 MG PO TABS: 500.0000 mg | ORAL_TABLET | Freq: Every day | ORAL | 0 refills | Status: AC

## 2019-05-30 MED ORDER — DEXAMETHASONE 6 MG PO TABS: 6.0000 mg | ORAL_TABLET | Freq: Every day | ORAL | 0 refills | Status: AC

## 2019-05-30 MED ORDER — ALBUTEROL SULFATE HFA 108 (90 BASE) MCG/ACT IN AERS: 2.0000 | INHALATION_SPRAY | Freq: Two times a day (BID) | RESPIRATORY_TRACT | 0 refills | Status: AC | PRN

## 2019-05-30 MED ORDER — ZINC SULFATE 220 (50 ZN) MG PO CAPS: 220.0000 mg | ORAL_CAPSULE | Freq: Every day | ORAL | 0 refills | Status: AC

## 2019-05-30 NOTE — Discharge Summary (Addendum)
Discharge Summary  Nicole Stephens [id number] DOB: 1965-08-22  PCP: Reita Cliche, MD  Admit date: 05/23/2019 Discharge date: 05/30/2019  Time spent: 35 minutes   Recommendations for Outpatient Follow-up:  1. Follow up with your PCP 2. Follow up with your oncologist 3. Take your medications as prescribed  4. Quarantine for 21 days from the time of positive Covid-19 test from 05/15/19 through 06/05/19.  Discharge Diagnoses:  Active Hospital Problems   Diagnosis Date Noted  . Acute respiratory failure due to COVID-19 ([place]) 05/24/2019  . Hypothyroidism 05/24/2019    Resolved Hospital Problems  No resolved problems to display.    Discharge Condition: Stable   Diet recommendation: Resume previous diet.  Vitals:   05/30/19 0440 05/30/19 0954  BP: 99/63   Pulse: 71   Resp: 18   Temp: 98.1 F (36.7 C)   SpO2: 93% (!) 83%    History of present illness:  54 year old female with known history of R breast cancer status post lumpectomy and radiation in 2018 [facility], stress incontinence, hyperlipidemia, hypothyroidism, chronic depression who presented from home with complaints of worsening shortness of breath and nonproductive cough.  COVID-19 screening test positive on 05/15/2019 at outpatient facility per the patient.  Hypoxic on presentation.  Not on oxygen supplementation at baseline.  Respiratory status improved after Remdesivir, decadron, bronchodialators and diuresing.  05/30/19: Seen in her room.  No acute events overnight.  Advised to quarantine for 21 days from the time of positive Covid-19 test reported as 05/15/19.  End quarantine date 06/05/19.  Patient understands and agrees to plan.  Hospital Course:  Active Problems:   Acute respiratory failure due to COVID-19 [facility])   Hypothyroidism  COVID-19 viral pneumonia  Reported positive COVID-19 test on 05/15/2019 Completed 5 days of remdesivir on 05/28/2019. Will complete 10 days of Decadron Continue albuterol inhaler as  needed BID for shortness of breath and wheezing. Continue vitamin D3, vitamin C and zinc daily Continue incentive spirometer every 1 hour while awake Continue flutter valve every 4 hours while awake Inflammatory markers trended down D-Dimer <0.27 Maintain O2 sat >92% Will discharge to home with oxygen supplementation for ambulation.  Transient sinus tachycardia likely related to acute lung illness In sinus rhythm on the monitor  Continue to treat underlying condition Continue management as stated above  History of right breast cancer status post lumpectomy and radiation in 2018 Chronically on tamoxifen Follow up with your oncologist post hospitalization.  Hypothyroidism Continue levothyroxine Follow up with your PCP  Chronic depression Stable Continue home medications Follow up with your PCP     Code Status: Full code   Consultants:  None  Procedures:  None    Discharge Exam: BP 99/63 (BP Location: Left Arm)   Pulse 71   Temp 98.1 F (36.7 C) (Oral)   Resp 18   Ht 5\' 10"  (1.778 m)   Wt 86.2 kg   SpO2 (!) 83%   BMI 27.26 kg/m  . General: 54 y.o. year-old female well developed well nourished in no acute distress.  Alert and oriented x3. . Cardiovascular: Regular rate and rhythm with no rubs or gallops.  No thyromegaly or JVD noted.   Marland Kitchen Respiratory: Clear to auscultation with no wheezes or rales. Good inspiratory effort. . Abdomen: Soft nontender nondistended with normal bowel sounds x4 quadrants. . Musculoskeletal: No lower extremity edema. 2/4 pulses in all 4 extremities. Marland Kitchen Psychiatry: Mood is appropriate for condition and setting  Discharge Instructions You were cared for by a hospitalist during your  hospital stay. If you have any questions about your discharge medications or the care you received while you were in the hospital after you are discharged, you can call the unit and asked to speak with the hospitalist on call if the hospitalist  that took care of you is not available. Once you are discharged, your primary care physician will handle any further medical issues. Please note that NO REFILLS for any discharge medications will be authorized once you are discharged, as it is imperative that you return to your primary care physician (or establish a relationship with a primary care physician if you do not have one) for your aftercare needs so that they can reassess your need for medications and monitor your lab values.  Discharge Instructions    MyChart COVID-19 home monitoring program   Complete by: May 30, 2019    Is the patient willing to use the [place] for home monitoring?: Yes   Temperature monitoring   Complete by: May 30, 2019    After how many days would you like to receive a notification of this patient's flowsheet entries?: 1     Allergies as of 05/30/2019      Reactions   Penicillins    Pt states she unsure of reaction   Sulfa Antibiotics    Pt states she unsure of reaction      Medication List    TAKE these medications   albuterol 108 (90 Base) MCG/ACT inhaler Commonly known as: VENTOLIN HFA Inhale 2 puffs into the lungs 2 (two) times daily as needed for wheezing or shortness of breath.   ascorbic acid 500 MG tablet Commonly known as: VITAMIN C Take 1 tablet (500 mg total) by mouth daily. Start taking on: May 31, 2019   cholecalciferol 25 MCG (1000 UT) tablet Commonly known as: VITAMIN D3 Take 1,000 Units by mouth daily.   dexamethasone 6 MG tablet Commonly known as: DECADRON Take 1 tablet (6 mg total) by mouth daily for 4 days. Start taking on: May 31, 2019   Fish Oil 1000 MG Caps Take 1,000 mg by mouth daily.   levothyroxine 125 MCG tablet Commonly known as: SYNTHROID Take 125-250 mcg by mouth every morning. 250 mcg on Sundays, 125 mcg all other days   Mucinex Fast-Max Chest Cong MS 100 MG/5ML liquid Generic drug: guaiFENesin Take 200 mg by mouth 3 (three) times daily as  needed for cough.   PARoxetine 40 MG tablet Commonly known as: PAXIL Take 40 mg by mouth daily.   tamoxifen 20 MG tablet Commonly known as: NOLVADEX Take 20 mg by mouth at bedtime.   zinc sulfate 220 (50 Zn) MG capsule Take 1 capsule (220 mg total) by mouth daily.            Durable Medical Equipment  (From admission, onward)         Start     Ordered   05/27/19 1609  For home use only DME oxygen  Once    Question Answer Comment  Length of Need 6 Months   Mode or (Route) Nasal cannula   Liters per Minute 6   Frequency Continuous (stationary and portable oxygen unit needed)   Oxygen conserving device No   Oxygen delivery system Gas      01 /03/21 1609         Allergies  Allergen Reactions  . Penicillins     Pt states she unsure of reaction  . Sulfa Antibiotics     Pt  states she unsure of reaction   Follow-up Information    Reita Cliche, MD. Call in 1 day(s).   Specialty: Internal Medicine Why: Please call for a post hospital follow up appointment. Contact information: [address] [place] [address] [phone]        Josepha Pigg, MD. Call in 1 day(s).   Specialty: Surgery Why: Please call for a post hospital follow up appointment. Contact information: [facility] [address] [place] [address] [phone]        Kimmick, Linus Mako, MD. Call in 1 day(s).   Specialty: Oncology Why: please call for a post hospital follow up appointment. Contact information: [facility] [address] [place] [address] [phone]            The results of significant diagnostics from this hospitalization (including imaging, microbiology, ancillary and laboratory) are listed below for reference.    Significant Diagnostic Studies: DG Chest Port 1 View  Result Date: 05/23/2019 CLINICAL DATA:  Hypoxia.  COVID-19 positive. EXAM: PORTABLE CHEST 1 VIEW COMPARISON:  None. FINDINGS: Patchy bilateral  airspace disease. Upper normal heart size with normal mediastinal contours. No pleural fluid or pneumothorax. No acute osseous abnormalities. IMPRESSION: Patchy bilateral airspace disease, consistent with pneumonia, pattern typical of COVID-19. Electronically Signed   By: Keith Rake M.D.   On: 05/23/2019 23:29    Microbiology: Recent Results (from the past 240 hour(s))  Blood Culture (routine x 2)     Status: None   Collection Time: 05/23/19 10:02 PM   Specimen: BLOOD LEFT HAND  Result Value Ref Range Status   Specimen Description BLOOD LEFT HAND  Final   Special Requests   Final    BOTTLES DRAWN AEROBIC AND ANAEROBIC Blood Culture adequate volume   Culture   Final    NO GROWTH 5 DAYS Performed at [facility], [address], [place],  [address]    Report Status 05/28/2019 FINAL  Final  Blood Culture (routine x 2)     Status: None   Collection Time: 05/23/19 10:33 PM   Specimen: BLOOD  Result Value Ref Range Status   Specimen Description BLOOD LEFT ANTECUBITAL  Final   Special Requests   Final    BOTTLES DRAWN AEROBIC AND ANAEROBIC Blood Culture adequate volume   Culture   Final    NO GROWTH 5 DAYS Performed at [facility], [place] [address], [place],  [address]    Report Status 05/28/2019 FINAL  Final     Labs: Basic Metabolic Panel: Recent Labs  Lab 05/25/19 0416 05/26/19 0500 05/27/19 0931 05/28/19 0408 05/29/19 0327  NA 141 140 138 139 139  K 4.3 4.1 3.3* 4.3 4.2  CL 104 105 104 104 100  CO2 25 22 22 24 28   GLUCOSE 143* 94 141* 115* 119*  BUN 14 17 21* 18 19  CREATININE 0.67 0.62 0.71 0.84 0.83  CALCIUM 9.3 8.8* 8.8* 9.2 9.2   Liver Function Tests: Recent Labs  Lab 05/25/19 0416 05/26/19 0500 05/27/19 0931 05/28/19 0408 05/29/19 0327  AST 26 30 23 21 19   ALT 27 32 32 31 27  ALKPHOS 62 52 58 60 64  BILITOT 0.2* 0.5 0.3 0.4 0.3  PROT 6.3* 6.2* 5.6* 6.5 6.7  ALBUMIN 3.0* 3.1* 2.8* 3.1* 3.4*   No results for input(s):  LIPASE, AMYLASE in the last 168 hours. No results for input(s): AMMONIA in the last 168 hours. CBC: Recent  Labs  Lab 05/24/19 0541 05/25/19 0416 05/26/19 0500 05/27/19 0931 05/28/19 0408  WBC 4.6 4.5 4.6 5.3 6.6  NEUTROABS 4.3 3.4 3.0 3.0 4.5  HGB 12.2 12.3 12.5 12.1 12.8  HCT 36.8 37.8 38.4 36.0 39.2  MCV 90.6 90.4 90.4 88.7 89.3  PLT 246 333 345 425* 478*   Cardiac Enzymes: No results for input(s): CKTOTAL, CKMB, CKMBINDEX, TROPONINI in the last 168 hours. BNP: BNP (last 3 results) Recent Labs    05/28/19 0408  BNP 25.5    ProBNP (last 3 results) No results for input(s): PROBNP in the last 8760 hours.  CBG: No results for input(s): GLUCAP in the last 168 hours.     Signed:  Kayleen Memos, MD [facility] 05/30/2019, 10:04 AM

## 2019-05-30 NOTE — TOC Transition Note (Signed)
Transition of Care [facility]) - CM/SW Discharge Note   Patient Details  Name: Tenlee Brozowski MRN: [id number] Date of Birth: 21-Jun-1965  [facility] CM/SW Contact:  Maryclare Labrador, RN Phone Number: 05/30/2019, 10:33 AM   Clinical Narrative:   Pt to [place] [facility] home today.  [employer] confirms that home equipment and portable tank for transport have been delivered.  No other CM needs identified - CM signing off      Barriers to Discharge: Continued Medical Work up   Patient Goals and CMS Choice Patient states their goals for this hospitalization and ongoing recovery are:: Pt did not state specific goal post discharge CMS Medicare.gov Compare Post Acute Care list provided to:: Patient Choice offered to / list presented to : Patient  Discharge Placement                       Discharge Plan and Services                DME Arranged: Oxygen DME Agency: [employer] Date DME Agency Contacted: 05/28/19 Time DME Agency Contacted: 1200 Representative spoke with at DME Agency: [email or web address]            Social Determinants of Health ([place]) Interventions     Readmission Risk Interventions No flowsheet data found.

## 2019-05-30 NOTE — Discharge Instructions (Signed)
10 Things You Can Do to Manage Your COVID-19 Symptoms at Home If you have possible or confirmed COVID-19: 1. Stay home from work and school. And stay away from other public places. If you must go out, avoid using any kind of public transportation, ridesharing, or taxis. 2. Monitor your symptoms carefully. If your symptoms get worse, call your healthcare provider immediately. 3. Get rest and stay hydrated. 4. If you have a medical appointment, call the healthcare provider ahead of time and tell them that you have or may have COVID-19. 5. For medical emergencies, call 911 and notify the dispatch personnel that you have or may have COVID-19. 6. Cover your cough and sneezes with a tissue or use the inside of your elbow. 7. Wash your hands often with soap and water for at least 20 seconds or clean your hands with an alcohol-based hand sanitizer that contains at least 60% alcohol. 8. As much as possible, stay in a specific room and away from other people in your home. Also, you should use a separate bathroom, if available. If you need to be around other people in or outside of the home, wear a mask. 9. Avoid sharing personal items with other people in your household, like dishes, towels, and bedding. 10. Clean all surfaces that are touched often, like counters, tabletops, and doorknobs. Use household cleaning sprays or wipes according to the label instructions. [email or web address] 11/22/2018 This information is not intended to replace advice given to you by your health care provider. Make sure you discuss any questions you have with your health care provider. Document Revised: 04/26/2019 Document Reviewed: 04/26/2019 Elsevier Patient Education  [place].   COVID-19 Frequently Asked Questions COVID-19 (coronavirus disease) is an infection that is caused by a large family of viruses. Some viruses cause illness in people and others cause illness in animals like camels, cats, and bats. In some  cases, the viruses that cause illness in animals can spread to humans. Where did the coronavirus come from? In December 2019, [place] told the [facility]) of several cases of lung disease (human respiratory illness). These cases were linked to an open seafood and livestock market in the city of [place]. The link to the seafood and livestock market suggests that the virus may have spread from animals to humans. However, since that first outbreak in December, the virus has also been shown to spread from person to person. What is the name of the disease and the virus? Disease name Early on, this disease was called novel coronavirus. This is because scientists determined that the disease was caused by a new (novel) respiratory virus. The World Health Organization [facility] has now named the disease COVID-19, or coronavirus disease. Virus name The virus that causes the disease is called severe acute respiratory syndrome coronavirus 2 (SARS-CoV-2). More information on disease and virus naming World Health Organization [facility]): www.who.int/emergencies/diseases/novel-coronavirus-2019/technical-guidance/naming-the-coronavirus-disease-(covid-2019)-and-the-virus-that-causes-it Who is at risk for complications from coronavirus disease? Some people may be at higher risk for complications from coronavirus disease. This includes older adults and people who have chronic diseases, such as heart disease, diabetes, and lung disease. If you are at higher risk for complications, take these extra precautions:  Stay home as much as possible.  Avoid social gatherings and travel.  Avoid close contact with others. Stay at least 6 ft (2 m) away from others, if possible.  Wash your hands often with soap and water for at least 20 seconds.  Avoid touching your face, mouth, nose, or eyes.  Keep supplies on hand at home, such as food, medicine, and cleaning supplies.  If you must go out in public, wear a cloth  face covering or face mask. Make sure your mask covers your nose and mouth. How does coronavirus disease spread? The virus that causes coronavirus disease spreads easily from person to person (is contagious). You may catch the virus by:  Breathing in droplets from an infected person. Droplets can be spread by a person breathing, speaking, singing, coughing, or sneezing.  Touching something, like a table or a doorknob, that was exposed to the virus (contaminated) and then touching your mouth, nose, or eyes. Can I get the virus from touching surfaces or objects? There is still a lot that we do not know about the virus that causes coronavirus disease. Scientists are basing a lot of information on what they know about similar viruses, such as:  Viruses cannot generally survive on surfaces for long. They need a human body (host) to survive.  It is more likely that the virus is spread by close contact with people who are sick (direct contact), such as through: ? Shaking hands or hugging. ? Breathing in respiratory droplets that travel through the air. Droplets can be spread by a person breathing, speaking, singing, coughing, or sneezing.  It is less likely that the virus is spread when a person touches a surface or object that has the virus on it (indirect contact). The virus may be able to enter the body if the person touches a surface or object and then touches his or her face, eyes, nose, or mouth. Can a person spread the virus without having symptoms of the disease? It may be possible for the virus to spread before a person has symptoms of the disease, but this is most likely not the main way the virus is spreading. It is more likely for the virus to spread by being in close contact with people who are sick and breathing in the respiratory droplets spread by a person breathing, speaking, singing, coughing, or sneezing. What are the symptoms of coronavirus disease? Symptoms vary from person to  person and can range from mild to severe. Symptoms may include:  Fever or chills.  Cough.  Difficulty breathing or feeling short of breath.  Headaches, body aches, or muscle aches.  Runny or stuffy (congested) nose.  Sore throat.  New loss of taste or smell.  Nausea, vomiting, or diarrhea. These symptoms can appear anywhere from 2 to 14 days after you have been exposed to the virus. Some people may not have any symptoms. If you develop symptoms, call your health care provider. People with severe symptoms may need hospital care. Should I be tested for this virus? Your health care provider will decide whether to test you based on your symptoms, history of exposure, and your risk factors. How does a health care provider test for this virus? Health care providers will collect samples to send for testing. Samples may include:  Taking a swab of fluid from the back of your nose and throat, your nose, or your throat.  Taking fluid from the lungs by having you cough up mucus (sputum) into a sterile cup.  Taking a blood sample. Is there a treatment or vaccine for this virus? Currently, there is no vaccine to prevent coronavirus disease. Also, there are no medicines like antibiotics or antivirals to treat the virus. A person who becomes sick is given supportive care, which means rest and fluids. A person may also  relieve his or her symptoms by using over-the-counter medicines that treat sneezing, coughing, and runny nose. These are the same medicines that a person takes for the common cold. If you develop symptoms, call your health care provider. People with severe symptoms may need hospital care. What can I do to protect myself and my family from this virus?     You can protect yourself and your family by taking the same actions that you would take to prevent the spread of other viruses. Take the following actions:  Wash your hands often with soap and water for at least 20 seconds. If soap  and water are not available, use alcohol-based hand sanitizer.  Avoid touching your face, mouth, nose, or eyes.  Cough or sneeze into a tissue, sleeve, or elbow. Do not cough or sneeze into your hand or the air. ? If you cough or sneeze into a tissue, throw it away immediately and wash your hands.  Disinfect objects and surfaces that you frequently touch every day.  Stay away from people who are sick.  Avoid going out in public, follow guidance from your state and local health authorities.  Avoid crowded indoor spaces. Stay at least 6 ft (2 m) away from others.  If you must go out in public, wear a cloth face covering or face mask. Make sure your mask covers your nose and mouth.  Stay home if you are sick, except to get medical care. Call your health care provider before you get medical care. Your health care provider will tell you how long to stay home.  Make sure your vaccines are up to date. Ask your health care provider what vaccines you need. What should I do if I need to travel? Follow travel recommendations from your local health authority, the CDC, and WHO. Travel information and advice  Centers for Disease Control and Prevention (CDC): BodyEditor.hu  World Health Organization [facility]): ThirdIncome.ca Know the risks and take action to protect your health  You are at higher risk of getting coronavirus disease if you are traveling to areas with an outbreak or if you are exposed to travelers from areas with an outbreak.  Wash your hands often and practice good hygiene to lower the risk of catching or spreading the virus. What should I do if I am sick? General instructions to stop the spread of infection  Wash your hands often with soap and water for at least 20 seconds. If soap and water are not available, use alcohol-based hand sanitizer.  Cough or sneeze into a tissue, sleeve, or  elbow. Do not cough or sneeze into your hand or the air.  If you cough or sneeze into a tissue, throw it away immediately and wash your hands.  Stay home unless you must get medical care. Call your health care provider or local health authority before you get medical care.  Avoid public areas. Do not take public transportation, if possible.  If you can, wear a mask if you must go out of the house or if you are in close contact with someone who is not sick. Make sure your mask covers your nose and mouth. Keep your home clean  Disinfect objects and surfaces that are frequently touched every day. This may include: ? Counters and tables. ? Doorknobs and light switches. ? Sinks and faucets. ? Electronics such as phones, remote controls, keyboards, computers, and tablets.  Wash dishes in hot, soapy water or use a dishwasher. Air-dry your dishes.  [place] laundry in  hot water. Prevent infecting other household members  Let healthy household members care for children and pets, if possible. If you have to care for children or pets, wash your hands often and wear a mask.  Sleep in a different bedroom or bed, if possible.  Do not share personal items, such as razors, toothbrushes, deodorant, combs, brushes, towels, and washcloths. Where to find more information Centers for Disease Control and Prevention (CDC)  Information and news updates: https://www.butler-gonzalez.com/ World Health Organization [facility])  Information and news updates: MissExecutive.com.ee  Coronavirus health topic: https://www.castaneda.info/  Questions and answers on COVID-19: OpportunityDebt.at  Global tracker: who.sprinklr.com American Academy of Pediatrics (AAP)  Information for families: www.healthychildren.org/English/health-issues/conditions/chest-lungs/Pages/2019-Novel-Coronavirus.aspx The coronavirus situation is changing rapidly. Check  your local health authority website or the CDC and [facility] websites for updates and news. When should I contact a health care provider?  Contact your health care provider if you have symptoms of an infection, such as fever or cough, and you: ? Have been near anyone who is known to have coronavirus disease. ? Have come into contact with a person who is suspected to have coronavirus disease. ? Have traveled to an area where there is an outbreak of COVID-19. When should I get emergency medical care?  Get help right away by calling your local emergency services (911 in the [place] if you have: ? Trouble breathing. ? Pain or pressure in your chest. ? Confusion. ? Blue-tinged lips and fingernails. ? Difficulty waking from sleep. ? Symptoms that get worse. Let the emergency medical personnel know if you think you have coronavirus disease. Summary  A new respiratory virus is spreading from person to person and causing COVID-19 (coronavirus disease).  The virus that causes COVID-19 appears to spread easily. It spreads from one person to another through droplets from breathing, speaking, singing, coughing, or sneezing.  Older adults and those with chronic diseases are at higher risk of disease. If you are at higher risk for complications, take extra precautions.  There is currently no vaccine to prevent coronavirus disease. There are no medicines, such as antibiotics or antivirals, to treat the virus.  You can protect yourself and your family by washing your hands often, avoiding touching your face, and covering your coughs and sneezes. This information is not intended to replace advice given to you by your health care provider. Make sure you discuss any questions you have with your health care provider. Document Revised: 03/09/2019 Document Reviewed: 09/05/2018 Elsevier Patient Education  2020 [place].   COVID-19 COVID-19 is a respiratory infection that is caused by a virus called severe  acute respiratory syndrome coronavirus 2 (SARS-CoV-2). The disease is also known as coronavirus disease or novel coronavirus. In some people, the virus may not cause any symptoms. In others, it may cause a serious infection. The infection can get worse quickly and can lead to complications, such as:  Pneumonia, or infection of the lungs.  Acute respiratory distress syndrome or ARDS. This is a condition in which fluid build-up in the lungs prevents the lungs from filling with air and passing oxygen into the blood.  Acute respiratory failure. This is a condition in which there is not enough oxygen passing from the lungs to the body or when carbon dioxide is not passing from the lungs out of the body.  Sepsis or septic shock. This is a serious bodily reaction to an infection.  Blood clotting problems.  Secondary infections due to bacteria or fungus.  Organ failure. This is when your  body's organs stop working. The virus that causes COVID-19 is contagious. This means that it can spread from person to person through droplets from coughs and sneezes (respiratory secretions). What are the causes? This illness is caused by a virus. You may catch the virus by:  Breathing in droplets from an infected person. Droplets can be spread by a person breathing, speaking, singing, coughing, or sneezing.  Touching something, like a table or a doorknob, that was exposed to the virus (contaminated) and then touching your mouth, nose, or eyes. What increases the risk? Risk for infection You are more likely to be infected with this virus if you:  Are within 6 feet (2 meters) of a person with COVID-19.  Provide care for or live with a person who is infected with COVID-19.  Spend time in crowded indoor spaces or live in shared housing. Risk for serious illness You are more likely to become seriously ill from the virus if you:  Are 60 years of age or older. The higher your age, the more you are at risk for  serious illness.  Live in a nursing home or long-term care facility.  Have cancer.  Have a long-term (chronic) disease such as: ? Chronic lung disease, including chronic obstructive pulmonary disease or asthma. ? A long-term disease that lowers your body's ability to fight infection (immunocompromised). ? Heart disease, including heart failure, a condition in which the arteries that lead to the heart become narrow or blocked (coronary artery disease), a disease which makes the heart muscle thick, weak, or stiff (cardiomyopathy). ? Diabetes. ? Chronic kidney disease. ? Sickle cell disease, a condition in which red blood cells have an abnormal "sickle" shape. ? Liver disease.  Are obese. What are the signs or symptoms? Symptoms of this condition can range from mild to severe. Symptoms may appear any time from 2 to 14 days after being exposed to the virus. They include:  A fever or chills.  A cough.  Difficulty breathing.  Headaches, body aches, or muscle aches.  Runny or stuffy (congested) nose.  A sore throat.  New loss of taste or smell. Some people may also have stomach problems, such as nausea, vomiting, or diarrhea. Other people may not have any symptoms of COVID-19. How is this diagnosed? This condition may be diagnosed based on:  Your signs and symptoms, especially if: ? You live in an area with a COVID-19 outbreak. ? You recently traveled to or from an area where the virus is common. ? You provide care for or live with a person who was diagnosed with COVID-19. ? You were exposed to a person who was diagnosed with COVID-19.  A physical exam.  Lab tests, which may include: ? Taking a sample of fluid from the back of your nose and throat (nasopharyngeal fluid), your nose, or your throat using a swab. ? A sample of mucus from your lungs (sputum). ? Blood tests.  Imaging tests, which may include, X-rays, CT scan, or ultrasound. How is this treated? At present,  there is no medicine to treat COVID-19. Medicines that treat other diseases are being used on a trial basis to see if they are effective against COVID-19. Your health care provider will talk with you about ways to treat your symptoms. For most people, the infection is mild and can be managed at home with rest, fluids, and over-the-counter medicines. Treatment for a serious infection usually takes places in a hospital intensive care unit (ICU). It may include one or  more of the following treatments. These treatments are given until your symptoms improve.  Receiving fluids and medicines through an IV.  Supplemental oxygen. Extra oxygen is given through a tube in the nose, a face mask, or a hood.  Positioning you to lie on your stomach (prone position). This makes it easier for oxygen to get into the lungs.  Continuous positive airway pressure (CPAP) or bi-level positive airway pressure (BPAP) machine. This treatment uses mild air pressure to keep the airways open. A tube that is connected to a motor delivers oxygen to the body.  Ventilator. This treatment moves air into and out of the lungs by using a tube that is placed in your windpipe.  Tracheostomy. This is a procedure to create a hole in the neck so that a breathing tube can be inserted.  Extracorporeal membrane oxygenation (ECMO). This procedure gives the lungs a chance to recover by taking over the functions of the heart and lungs. It supplies oxygen to the body and removes carbon dioxide. Follow these instructions at home: Lifestyle  If you are sick, stay home except to get medical care. Your health care provider will tell you how long to stay home. Call your health care provider before you go for medical care.  Rest at home as told by your health care provider.  Do not use any products that contain nicotine or tobacco, such as cigarettes, e-cigarettes, and chewing tobacco. If you need help quitting, ask your health care  provider.  Return to your normal activities as told by your health care provider. Ask your health care provider what activities are safe for you. General instructions  Take over-the-counter and prescription medicines only as told by your health care provider.  Drink enough fluid to keep your urine pale yellow.  Keep all follow-up visits as told by your health care provider. This is important. How is this prevented?  There is no vaccine to help prevent COVID-19 infection. However, there are steps you can take to protect yourself and others from this virus. To protect yourself:   Do not travel to areas where COVID-19 is a risk. The areas where COVID-19 is reported change often. To identify high-risk areas and travel restrictions, check the CDC travel website: FatFares.com.br  If you live in, or must travel to, an area where COVID-19 is a risk, take precautions to avoid infection. ? Stay away from people who are sick. ? Wash your hands often with soap and water for 20 seconds. If soap and water are not available, use an alcohol-based hand sanitizer. ? Avoid touching your mouth, face, eyes, or nose. ? Avoid going out in public, follow guidance from your state and local health authorities. ? If you must go out in public, wear a cloth face covering or face mask. Make sure your mask covers your nose and mouth. ? Avoid crowded indoor spaces. Stay at least 6 feet (2 meters) away from others. ? Disinfect objects and surfaces that are frequently touched every day. This may include:  Counters and tables.  Doorknobs and light switches.  Sinks and faucets.  Electronics, such as phones, remote controls, keyboards, computers, and tablets. To protect others: If you have symptoms of COVID-19, take steps to prevent the virus from spreading to others.  If you think you have a COVID-19 infection, contact your health care provider right away. Tell your health care team that you think you  may have a COVID-19 infection.  Stay home. Leave your house only to seek  medical care. Do not use public transport.  Do not travel while you are sick.  Wash your hands often with soap and water for 20 seconds. If soap and water are not available, use alcohol-based hand sanitizer.  Stay away from other members of your household. Let healthy household members care for children and pets, if possible. If you have to care for children or pets, wash your hands often and wear a mask. If possible, stay in your own room, separate from others. Use a different bathroom.  Make sure that all people in your household wash their hands well and often.  Cough or sneeze into a tissue or your sleeve or elbow. Do not cough or sneeze into your hand or into the air.  Wear a cloth face covering or face mask. Make sure your mask covers your nose and mouth. Where to find more information  Centers for Disease Control and Prevention: PurpleGadgets.be  World Health Organization: https://www.castaneda.info/ Contact a health care provider if:  You live in or have traveled to an area where COVID-19 is a risk and you have symptoms of the infection.  You have had contact with someone who has COVID-19 and you have symptoms of the infection. Get help right away if:  You have trouble breathing.  You have pain or pressure in your chest.  You have confusion.  You have bluish lips and fingernails.  You have difficulty waking from sleep.  You have symptoms that get worse. These symptoms may represent a serious problem that is an emergency. Do not wait to see if the symptoms will go away. Get medical help right away. Call your local emergency services (911 in the [place] Do not drive yourself to the hospital. Let the emergency medical personnel know if you think you have COVID-19. Summary  COVID-19 is a respiratory infection that is caused by a virus. It is also known as  coronavirus disease or novel coronavirus. It can cause serious infections, such as pneumonia, acute respiratory distress syndrome, acute respiratory failure, or sepsis.  The virus that causes COVID-19 is contagious. This means that it can spread from person to person through droplets from breathing, speaking, singing, coughing, or sneezing.  You are more likely to develop a serious illness if you are 48 years of age or older, have a weak immune system, live in a nursing home, or have chronic disease.  There is no medicine to treat COVID-19. Your health care provider will talk with you about ways to treat your symptoms.  Take steps to protect yourself and others from infection. Wash your hands often and disinfect objects and surfaces that are frequently touched every day. Stay away from people who are sick and wear a mask if you are sick. This information is not intended to replace advice given to you by your health care provider. Make sure you discuss any questions you have with your health care provider. Document Revised: 03/09/2019 Document Reviewed: 06/15/2018 Elsevier Patient Education  2020 [place].  COVID-19: How to Protect Yourself and Others Know how it spreads  There is currently no vaccine to prevent coronavirus disease 2019 (COVID-19).  The best way to prevent illness is to avoid being exposed to this virus.  The virus is thought to spread mainly from person-to-person. ? Between people who are in close contact with one another (within about 6 feet). ? Through respiratory droplets produced when an infected person coughs, sneezes or talks. ? These droplets can land in the mouths or noses  of people who are nearby or possibly be inhaled into the lungs. ? COVID-19 may be spread by people who are not showing symptoms. Everyone should Clean your hands often  Wash your hands often with soap and water for at least 20 seconds especially after you have been in a public place, or  after blowing your nose, coughing, or sneezing.  If soap and water are not readily available, use a hand sanitizer that contains at least 60% alcohol. Cover all surfaces of your hands and rub them together until they feel dry.  Avoid touching your eyes, nose, and mouth with unwashed hands. Avoid close contact  Limit contact with others as much as possible.  Avoid close contact with people who are sick.  Put distance between yourself and other people. ? Remember that some people without symptoms may be able to spread virus. ? This is especially important for people who are at higher risk of getting very [employer]cy Cover your mouth and nose with a mask when around others  You could spread COVID-19 to others even if you do not feel sick.  Everyone should wear a mask in public settings and when around people not living in their household, especially when social distancing is difficult to maintain. ? Masks should not be placed on young children under age 18, anyone who has trouble breathing, or is unconscious, incapacitated or otherwise unable to remove the mask without assistance.  The mask is meant to protect other people in case you are infected.  Do NOT use a facemask meant for a Dietitian.  Continue to keep about 6 feet between yourself and others. The mask is not a substitute for social distancing. Cover coughs and sneezes  Always cover your mouth and nose with a tissue when you cough or sneeze or use the inside of your elbow.  Throw used tissues in the trash.  Immediately wash your hands with soap and water for at least 20 seconds. If soap and water are not readily available, clean your hands with a hand sanitizer that contains at least 60% alcohol. Clean and disinfect  Clean AND disinfect frequently touched surfaces daily. This includes tables, doorknobs, light switches, countertops, handles,  desks, phones, keyboards, toilets, faucets, and sinks. [email or web address]fr  If surfaces are dirty, clean them: Use detergent or soap and water prior to disinfection.  Then, use a household disinfectant. You can see a list of EPA-registered household disinfectants here. [email or web address].[email or web address] 01/24/2019 This information is not intended to replace advice given to you by your health care provider. Make sure you discuss any questions you have with your health care provider. Document Revised: 02/01/2019 Document Reviewed: 11/30/2018 Elsevier Patient Education  [place] Under Monitoring Name: Nicole Stephens  Location: [place] [address]   CORONAVIRUS DISEASE 2019 (COVID-19) Guidance for Persons Under Investigation You are being tested for the virus that causes coronavirus disease 2019 (COVID-19). Public health actions are necessary to ensure protection of your health and the health of others, and to prevent further spread of infection. COVID-19 is caused by a virus that can cause symptoms, such as fever, cough, and shortness of breath. The primary transmission from person to person is by coughing or sneezing. On June 22, 2018, the [place] announced a [employer] Emergency of International Concern and on June 23, 2018 the U.S. Department of Health and Human Services declared a public health emergency. If the virus that causesCOVID-19 spreads in the community,  it could have severe public health consequences.  As a person under investigation for COVID-19, the [place] advises you to adhere to the following guidance until your test results are reported to you. If your test result is positive, you will receive additional information from your provider and your local health department at that time.   Remain  at home until you are cleared by your health provider or public health authorities.   Keep a log of visitors to your home using the form provided. Any visitors to your home must be aware of your isolation status.  If you plan to move to a new address or leave the county, notify the local health department in your county.  Call a doctor or seek care if you have an urgent medical need. Before seeking medical care, call ahead and get instructions from the provider before arriving at the medical office, clinic or hospital. Notify them that you are being tested for the virus that causes COVID-19 so arrangements can be made, as necessary, to prevent transmission to others in the healthcare setting. Next, notify the local health department in your county.  If a medical emergency arises and you need to call 911, inform the first responders that you are being tested for the virus that causes COVID-19. Next, notify the local health department in your county.  Adhere to all guidance set forth by the [place] for [facility] of patients that is based on guidance from the Center for Disease Control and Prevention with suspected or confirmed COVID-19. It is provided with this guidance for Persons Under Investigation.  Your health and the health of our community are our top priorities. Public Health officials remain available to provide assistance and counseling to you about COVID-19 and compliance with this guidance.  Provider: ____________________________________________________________ Date: ______/_____/_________  By signing below, you acknowledge that you have read and agree to comply with this Guidance for Persons Under Investigation. ______________________________________________________________ Date: ______/_____/_________  WHO DO I CALL? You can find a list of local health departments here: [email or web address]  Department: ____________________________________________________________________ Contact Name: ________________________________________________________________________ Telephone: ___________________________________________________________________________  Marice Potter, [place], Communicable Disease Branch COVID-19 Guidance for Persons Under Investigation July 29, 2018   Person Under Monitoring Name: Nicole Stephens  Location: [place] [address]   Infection Prevention Recommendations for Individuals Confirmed to have, or Being Evaluated for, 2019 Novel Coronavirus (COVID-19) Infection Who Receive Care at Home  Individuals who are confirmed to have, or are being evaluated for, COVID-19 should follow the prevention steps below until a healthcare provider or local or state health department says they can return to normal activities.  Stay home except to get medical care You should restrict activities outside your home, except for getting medical care. Do not go to work, school, or public areas, and do not use public transportation or taxis.  Call ahead before visiting your doctor Before your medical appointment, call the healthcare provider and tell them that you have, or are being evaluated for, COVID-19 infection. This will help the healthcare provider's office take steps to keep other people from getting infected. Ask your healthcare provider to call the local or state health department.  Monitor your symptoms Seek prompt medical attention if your illness is worsening (e.g., difficulty breathing). Before going to your medical appointment, call the healthcare provider and tell them that you have, or are being  evaluated for, COVID-19 infection. Ask your healthcare provider to call the local or state health department.  Wear a facemask You should wear a facemask that covers your nose and mouth when you are in the same room with other people  and when you visit a healthcare provider. People who live with or visit you should also wear a facemask while they are in the same room with you.  Separate yourself from other people in your home As much as possible, you should stay in a different room from other people in your home. Also, you should use a separate bathroom, if available.  Avoid sharing household items You should not share dishes, drinking glasses, cups, eating utensils, towels, bedding, or other items with other people in your home. After using these items, you should wash them thoroughly with soap and water.  Cover your coughs and sneezes Cover your mouth and nose with a tissue when you cough or sneeze, or you can cough or sneeze into your sleeve. Throw used tissues in a lined trash can, and immediately wash your hands with soap and water for at least 20 seconds or use an alcohol-based hand rub.  Wash your [facility] your hands often and thoroughly with soap and water for at least 20 seconds. You can use an alcohol-based hand sanitizer if soap and water are not available and if your hands are not visibly dirty. Avoid touching your eyes, nose, and mouth with unwashed hands.   Prevention Steps for Caregivers and Household Members of Individuals Confirmed to have, or Being Evaluated for, COVID-19 Infection Being Cared for in the Home  If you live with, or provide care at home for, a person confirmed to have, or being evaluated for, COVID-19 infection please follow these guidelines to prevent infection:  Follow healthcare provider's instructions Make sure that you understand and can help the patient follow any healthcare provider instructions for all care.  Provide for the patient's basic needs You should help the patient with basic needs in the home and provide support for getting groceries, prescriptions, and other personal needs.  Monitor the patient's symptoms If they are getting sicker, call his or her medical  provider and tell them that the patient has, or is being evaluated for, COVID-19 infection. This will help the healthcare provider's office take steps to keep other people from getting infected. Ask the healthcare provider to call the local or state health department.  Limit the number of people who have contact with the patient  If possible, have only one caregiver for the patient.  Other household members should stay in another home or place of residence. If this is not possible, they should stay  in another room, or be separated from the patient as much as possible. Use a separate bathroom, if available.  Restrict visitors who do not have an essential need to be in the home.  Keep older adults, very young children, and other sick people away from the patient Keep older adults, very young children, and those who have compromised immune systems or chronic health conditions away from the patient. This includes people with chronic heart, lung, or kidney conditions, diabetes, and cancer.  Ensure good ventilation Make sure that shared spaces in the home have good air flow, such as from an air conditioner or an opened window, weather permitting.  Wash your hands often  Wash your hands often and thoroughly with soap and water for at least 20 seconds. You can use an alcohol based hand  sanitizer if soap and water are not available and if your hands are not visibly dirty.  Avoid touching your eyes, nose, and mouth with unwashed hands.  Use disposable paper towels to dry your hands. If not available, use dedicated cloth towels and replace them when they become wet.  Wear a facemask and gloves  Wear a disposable facemask at all times in the room and gloves when you touch or have contact with the patient's blood, body fluids, and/or secretions or excretions, such as sweat, saliva, sputum, nasal mucus, vomit, urine, or feces.  Ensure the mask fits over your nose and mouth tightly, and do not touch  it during use.  Throw out disposable facemasks and gloves after using them. Do not reuse.  Wash your hands immediately after removing your facemask and gloves.  If your personal clothing becomes contaminated, carefully remove clothing and launder. Wash your hands after handling contaminated clothing.  Place all used disposable facemasks, gloves, and other waste in a lined container before disposing them with other household waste.  Remove gloves and wash your hands immediately after handling these items.  Do not share dishes, glasses, or other household items with the patient  Avoid sharing household items. You should not share dishes, drinking glasses, cups, eating utensils, towels, bedding, or other items with a patient who is confirmed to have, or being evaluated for, COVID-19 infection.  After the person uses these items, you should wash them thoroughly with soap and water.  Wash laundry thoroughly  Immediately remove and wash clothes or bedding that have blood, body fluids, and/or secretions or excretions, such as sweat, saliva, sputum, nasal mucus, vomit, urine, or feces, on them.  Wear gloves when handling laundry from the patient.  Read and follow directions on labels of laundry or clothing items and detergent. In general, wash and dry with the warmest temperatures recommended on the label.  Clean all areas the individual has used often  Clean all touchable surfaces, such as counters, tabletops, doorknobs, bathroom fixtures, toilets, phones, keyboards, tablets, and bedside tables, every day. Also, clean any surfaces that may have blood, body fluids, and/or secretions or excretions on them.  Wear gloves when cleaning surfaces the patient has come in contact with.  Use a diluted bleach solution (e.g., dilute bleach with 1 part bleach and 10 parts water) or a household disinfectant with a label that says EPA-registered for coronaviruses. To make a bleach solution at home, add 1  tablespoon of bleach to 1 quart (4 cups) of water. For a larger supply, add  cup of bleach to 1 gallon (16 cups) of water.  Read labels of cleaning products and follow recommendations provided on product labels. Labels contain instructions for safe and effective use of the cleaning product including precautions you should take when applying the product, such as wearing gloves or eye protection and making sure you have good ventilation during use of the product.  Remove gloves and wash hands immediately after cleaning.  Monitor yourself for signs and symptoms of illness Caregivers and household members are considered close contacts, should monitor their health, and will be asked to limit movement outside of the home to the extent possible. Follow the monitoring steps for close contacts listed on the symptom monitoring form.   ? If you have additional questions, contact your local health department or call the epidemiologist on call at [phone] (available 24/7). ? This guidance is subject to change. For the most up-to-date guidance from [facility], please refer to their  website: [email or web address]

## 2019-05-30 NOTE — Progress Notes (Signed)
NURSING PROGRESS NOTE  Nicole Stephens [id number] Discharge Data: 05/30/2019 3:22 PM Attending Provider: Kayleen Memos, DO [id number], Nicole Gash, MD     Nicole Stephens to be D/C'd Home per MD order.  Discussed with the patient the After Visit Summary and all questions fully answered. All IV's discontinued with no bleeding noted. All belongings returned to patient for patient to take home.   Waiting for home oxygen set up for pt to go home.  Last Vital Signs:  Blood pressure 116/77, pulse 95, temperature 97.9 F (36.6 C), temperature source Oral, resp. rate (!) 22, height 5\' 10"  (1.778 m), weight 86.2 kg, SpO2 90 %.  Discharge Medication List Allergies as of 05/30/2019      Reactions   Penicillins    Pt states she unsure of reaction   Sulfa Antibiotics    Pt states she unsure of reaction      Medication List    TAKE these medications   albuterol 108 (90 Base) MCG/ACT inhaler Commonly known as: VENTOLIN HFA Inhale 2 puffs into the lungs 2 (two) times daily as needed for wheezing or shortness of breath.   ascorbic acid 500 MG tablet Commonly known as: VITAMIN C Take 1 tablet (500 mg total) by mouth daily. Start taking on: May 31, 2019   cholecalciferol 25 MCG (1000 UT) tablet Commonly known as: VITAMIN D3 Take 1,000 Units by mouth daily.   dexamethasone 6 MG tablet Commonly known as: DECADRON Take 1 tablet (6 mg total) by mouth daily for 4 days. Start taking on: May 31, 2019   Fish Oil 1000 MG Caps Take 1,000 mg by mouth daily.   levothyroxine 125 MCG tablet Commonly known as: SYNTHROID Take 125-250 mcg by mouth every morning. 250 mcg on Sundays, 125 mcg all other days   Mucinex Fast-Max Chest Cong MS 100 MG/5ML liquid Generic drug: guaiFENesin Take 200 mg by mouth 3 (three) times daily as needed for cough.   PARoxetine 40 MG tablet Commonly known as: PAXIL Take 40 mg by mouth daily.   tamoxifen 20 MG tablet Commonly known as: NOLVADEX Take 20 mg by mouth at  bedtime.   zinc sulfate 220 (50 Zn) MG capsule Take 1 capsule (220 mg total) by mouth daily.            Durable Medical Equipment  (From admission, onward)         Start     Ordered   05/30/19 1031  For home use only DME oxygen  Once    Question Answer Comment  Length of Need 6 Months   Mode or (Route) Nasal cannula   Liters per Minute 2   Frequency Continuous (stationary and portable oxygen unit needed)   Oxygen conserving device Yes   Oxygen delivery system Gas      01 /06/21 1030

## 2019-05-30 NOTE — Plan of Care (Signed)
  Problem: Education: Goal: Knowledge of risk factors and measures for prevention of condition will improve Outcome: Progressing   Problem: Coping: Goal: Psychosocial and spiritual needs will be supported Outcome: Progressing   Problem: Respiratory: Goal: Will maintain a patent airway Outcome: Progressing Goal: Complications related to the disease process, condition or treatment will be avoided or minimized Outcome: Progressing   Problem: Education: Goal: Knowledge of General Education information will improve Description: Including pain rating scale, medication(s)/side effects and non-pharmacologic comfort measures Outcome: Progressing   Problem: Health Behavior/Discharge Planning: Goal: Ability to manage health-related needs will improve Outcome: Progressing   Problem: Clinical Measurements: Goal: Ability to maintain clinical measurements within normal limits will improve Outcome: Progressing Goal: Will remain free from infection Outcome: Progressing Goal: Diagnostic test results will improve Outcome: Progressing Goal: Respiratory complications will improve Outcome: Progressing Goal: Cardiovascular complication will be avoided Outcome: Progressing   Problem: Activity: Goal: Risk for activity intolerance will decrease Outcome: Progressing   Problem: Nutrition: Goal: Adequate nutrition will be maintained Outcome: Progressing   Problem: Coping: Goal: Level of anxiety will decrease Outcome: Progressing   Problem: Elimination: Goal: Will not experience complications related to bowel motility Outcome: Progressing Goal: Will not experience complications related to urinary retention Outcome: Progressing   Problem: Pain Managment: Goal: General experience of comfort will improve Outcome: Progressing   Problem: Safety: Goal: Ability to remain free from injury will improve Outcome: Progressing   Problem: Skin Integrity: Goal: Risk for impaired skin integrity will  decrease Outcome: Progressing   Problem: Education: Goal: Knowledge of General Education information will improve Description: Including pain rating scale, medication(s)/side effects and non-pharmacologic comfort measures Outcome: Progressing   Problem: Health Behavior/Discharge Planning: Goal: Ability to manage health-related needs will improve Outcome: Progressing   Problem: Clinical Measurements: Goal: Ability to maintain clinical measurements within normal limits will improve Outcome: Progressing Goal: Will remain free from infection Outcome: Progressing Goal: Diagnostic test results will improve Outcome: Progressing Goal: Respiratory complications will improve Outcome: Progressing Goal: Cardiovascular complication will be avoided Outcome: Progressing   Problem: Activity: Goal: Risk for activity intolerance will decrease Outcome: Progressing   Problem: Nutrition: Goal: Adequate nutrition will be maintained Outcome: Progressing   Problem: Coping: Goal: Level of anxiety will decrease Outcome: Progressing   Problem: Elimination: Goal: Will not experience complications related to bowel motility Outcome: Progressing Goal: Will not experience complications related to urinary retention Outcome: Progressing   Problem: Pain Managment: Goal: General experience of comfort will improve Outcome: Progressing   Problem: Safety: Goal: Ability to remain free from injury will improve Outcome: Progressing   Problem: Skin Integrity: Goal: Risk for impaired skin integrity will decrease Outcome: Progressing   

## 2019-05-30 NOTE — Progress Notes (Signed)
   Vital Signs MEWS/VS Documentation      05/29/2019 1758 05/29/2019 1900 05/29/2019 2037 05/29/2019 2134   MEWS Score:  0  0  0  0   MEWS Score Color:  Green  Green  Green  Green   Resp:  18  --  20  19   Pulse:  83  --  91  93   BP:  128/78  --  131/77  119/70   Temp:  97.8 F (36.6 C)  --  98.1 F (36.7 C)  99.2 F (37.3 C)   O2 Device:  Nasal Cannula  --  Room Air  Room Air   O2 Flow Rate (L/min):  2 L/min  --  --  --   Level of Consciousness:  --  --  --  Vonzell Schlatter D Calob Baskette 05/30/2019,3:08 AM

## 2019-05-30 NOTE — Progress Notes (Signed)
SATURATION QUALIFICATIONS: (This note is used to comply with regulatory documentation for home oxygen)  Patient Saturations on Room Air at Rest = 94%  Patient Saturations on Room Air while Ambulating = 83%  Patient Saturations on 2Liters of oxygen while Ambulating = 92%  Please briefly explain why patient needs home oxygen: Patient needs atleast 2L of O2 during ambulation to maintain sats above 90%

## 2019-05-30 NOTE — Progress Notes (Addendum)
Went into patient's room to review discharge information. IV site was removed and upon getting patient ready to go home the nurse discovered that the  pt's oxygen tanks that were delivered to her room were all left on and are now empty. Called Case Manager to inform that all of five of the patient's oxygen tanks that had been delivered were on and are now empty. Pt to stay at hospital until we are ensured that pt has all of the supplies that she needs at home. Case Manager waiting to hear back from [place] about home supplies  and will inform care team once things have been clarified. CM will also inform agency of pt's oxygen tanks being empty.

## 2019-06-05 DIAGNOSIS — R7989 Other specified abnormal findings of blood chemistry: Secondary | ICD-10-CM | POA: Diagnosis not present

## 2019-06-05 DIAGNOSIS — E782 Mixed hyperlipidemia: Secondary | ICD-10-CM | POA: Diagnosis not present

## 2019-06-05 DIAGNOSIS — E039 Hypothyroidism, unspecified: Secondary | ICD-10-CM | POA: Diagnosis not present

## 2019-06-05 DIAGNOSIS — Z0000 Encounter for general adult medical examination without abnormal findings: Secondary | ICD-10-CM | POA: Diagnosis not present

## 2019-06-08 DIAGNOSIS — Z0000 Encounter for general adult medical examination without abnormal findings: Secondary | ICD-10-CM | POA: Diagnosis not present

## 2019-06-08 DIAGNOSIS — E039 Hypothyroidism, unspecified: Secondary | ICD-10-CM | POA: Diagnosis not present

## 2019-06-08 DIAGNOSIS — E782 Mixed hyperlipidemia: Secondary | ICD-10-CM | POA: Diagnosis not present

## 2019-06-08 DIAGNOSIS — Z124 Encounter for screening for malignant neoplasm of cervix: Secondary | ICD-10-CM | POA: Diagnosis not present

## 2019-06-08 DIAGNOSIS — N951 Menopausal and female climacteric states: Secondary | ICD-10-CM | POA: Diagnosis not present

## 2019-06-11 ENCOUNTER — Other Ambulatory Visit: Payer: Self-pay | Admitting: Internal Medicine

## 2019-06-11 DIAGNOSIS — J1282 Pneumonia due to coronavirus disease 2019: Secondary | ICD-10-CM

## 2019-06-23 DIAGNOSIS — E039 Hypothyroidism, unspecified: Secondary | ICD-10-CM | POA: Diagnosis not present

## 2019-06-29 DIAGNOSIS — U071 COVID-19: Secondary | ICD-10-CM | POA: Diagnosis not present

## 2019-08-02 DIAGNOSIS — N951 Menopausal and female climacteric states: Secondary | ICD-10-CM | POA: Diagnosis not present

## 2019-08-02 DIAGNOSIS — Z780 Asymptomatic menopausal state: Secondary | ICD-10-CM | POA: Diagnosis not present

## 2019-09-17 ENCOUNTER — Ambulatory Visit
Admission: EM | Admit: 2019-09-17 | Discharge: 2019-09-17 | Discharge status: Absent without leave | Payer: BC Managed Care – PPO

## 2019-09-17 ENCOUNTER — Other Ambulatory Visit: Payer: Self-pay

## 2019-11-16 DIAGNOSIS — E039 Hypothyroidism, unspecified: Secondary | ICD-10-CM | POA: Diagnosis not present

## 2019-11-28 DIAGNOSIS — I200 Unstable angina: Secondary | ICD-10-CM | POA: Diagnosis not present

## 2019-11-28 DIAGNOSIS — C50811 Malignant neoplasm of overlapping sites of right female breast: Secondary | ICD-10-CM | POA: Diagnosis not present

## 2019-11-28 DIAGNOSIS — Z79810 Long term (current) use of selective estrogen receptor modulators (SERMs): Secondary | ICD-10-CM | POA: Diagnosis not present

## 2019-11-28 DIAGNOSIS — R232 Flushing: Secondary | ICD-10-CM | POA: Diagnosis not present

## 2019-11-28 DIAGNOSIS — Z170 Estrogen receptor positive status [ER+]: Secondary | ICD-10-CM | POA: Diagnosis not present

## 2019-11-28 DIAGNOSIS — N6489 Other specified disorders of breast: Secondary | ICD-10-CM | POA: Diagnosis not present

## 2020-03-21 DIAGNOSIS — E039 Hypothyroidism, unspecified: Secondary | ICD-10-CM | POA: Diagnosis not present

## 2020-05-20 DIAGNOSIS — Z20828 Contact with and (suspected) exposure to other viral communicable diseases: Secondary | ICD-10-CM | POA: Diagnosis not present

## 2020-06-20 DIAGNOSIS — E039 Hypothyroidism, unspecified: Secondary | ICD-10-CM | POA: Diagnosis not present

## 2020-06-20 DIAGNOSIS — E669 Obesity, unspecified: Secondary | ICD-10-CM | POA: Diagnosis not present

## 2020-12-02 DIAGNOSIS — T451X5A Adverse effect of antineoplastic and immunosuppressive drugs, initial encounter: Secondary | ICD-10-CM | POA: Diagnosis not present

## 2020-12-02 DIAGNOSIS — Z8249 Family history of ischemic heart disease and other diseases of the circulatory system: Secondary | ICD-10-CM | POA: Diagnosis not present

## 2020-12-02 DIAGNOSIS — Z79810 Long term (current) use of selective estrogen receptor modulators (SERMs): Secondary | ICD-10-CM | POA: Diagnosis not present

## 2020-12-02 DIAGNOSIS — R079 Chest pain, unspecified: Secondary | ICD-10-CM | POA: Diagnosis not present

## 2020-12-02 DIAGNOSIS — I200 Unstable angina: Secondary | ICD-10-CM | POA: Diagnosis not present

## 2020-12-02 DIAGNOSIS — Z9189 Other specified personal risk factors, not elsewhere classified: Secondary | ICD-10-CM | POA: Diagnosis not present

## 2020-12-02 DIAGNOSIS — N6489 Other specified disorders of breast: Secondary | ICD-10-CM | POA: Diagnosis not present

## 2020-12-02 DIAGNOSIS — E785 Hyperlipidemia, unspecified: Secondary | ICD-10-CM | POA: Diagnosis not present

## 2020-12-02 DIAGNOSIS — C50811 Malignant neoplasm of overlapping sites of right female breast: Secondary | ICD-10-CM | POA: Diagnosis not present

## 2020-12-02 DIAGNOSIS — R232 Flushing: Secondary | ICD-10-CM | POA: Diagnosis not present

## 2020-12-02 DIAGNOSIS — Z170 Estrogen receptor positive status [ER+]: Secondary | ICD-10-CM | POA: Diagnosis not present

## 2020-12-05 ENCOUNTER — Other Ambulatory Visit (HOSPITAL_COMMUNITY): Payer: Self-pay | Admitting: Cardiovascular Disease

## 2020-12-05 ENCOUNTER — Other Ambulatory Visit (HOSPITAL_BASED_OUTPATIENT_CLINIC_OR_DEPARTMENT_OTHER): Payer: Self-pay | Admitting: Cardiovascular Disease

## 2020-12-05 DIAGNOSIS — E782 Mixed hyperlipidemia: Secondary | ICD-10-CM

## 2020-12-08 ENCOUNTER — Ambulatory Visit (HOSPITAL_BASED_OUTPATIENT_CLINIC_OR_DEPARTMENT_OTHER)
Admission: RE | Admit: 2020-12-08 | Discharge: 2020-12-08 | Disposition: A | Discharge status: Home / Self Care | Payer: Self-pay | Source: Ambulatory Visit | Attending: Cardiovascular Disease | Admitting: Cardiovascular Disease

## 2020-12-08 ENCOUNTER — Other Ambulatory Visit: Payer: Self-pay

## 2020-12-08 DIAGNOSIS — E782 Mixed hyperlipidemia: Secondary | ICD-10-CM | POA: Insufficient documentation

## 2020-12-26 DIAGNOSIS — Z23 Encounter for immunization: Secondary | ICD-10-CM | POA: Diagnosis not present

## 2020-12-26 DIAGNOSIS — Z0000 Encounter for general adult medical examination without abnormal findings: Secondary | ICD-10-CM | POA: Diagnosis not present

## 2020-12-26 DIAGNOSIS — E039 Hypothyroidism, unspecified: Secondary | ICD-10-CM | POA: Diagnosis not present

## 2020-12-26 DIAGNOSIS — Z1211 Encounter for screening for malignant neoplasm of colon: Secondary | ICD-10-CM | POA: Diagnosis not present

## 2021-01-02 DIAGNOSIS — Z0000 Encounter for general adult medical examination without abnormal findings: Secondary | ICD-10-CM | POA: Diagnosis not present

## 2021-01-02 DIAGNOSIS — E039 Hypothyroidism, unspecified: Secondary | ICD-10-CM | POA: Diagnosis not present

## 2021-01-02 DIAGNOSIS — Z13220 Encounter for screening for lipoid disorders: Secondary | ICD-10-CM | POA: Diagnosis not present

## 2021-01-15 DIAGNOSIS — L821 Other seborrheic keratosis: Secondary | ICD-10-CM | POA: Diagnosis not present

## 2021-01-15 DIAGNOSIS — L814 Other melanin hyperpigmentation: Secondary | ICD-10-CM | POA: Diagnosis not present

## 2021-01-15 DIAGNOSIS — L578 Other skin changes due to chronic exposure to nonionizing radiation: Secondary | ICD-10-CM | POA: Diagnosis not present

## 2021-01-15 DIAGNOSIS — D1801 Hemangioma of skin and subcutaneous tissue: Secondary | ICD-10-CM | POA: Diagnosis not present

## 2021-03-26 DIAGNOSIS — L820 Inflamed seborrheic keratosis: Secondary | ICD-10-CM | POA: Diagnosis not present

## 2021-03-26 DIAGNOSIS — L570 Actinic keratosis: Secondary | ICD-10-CM | POA: Diagnosis not present

## 2021-12-07 DIAGNOSIS — Z79810 Long term (current) use of selective estrogen receptor modulators (SERMs): Secondary | ICD-10-CM | POA: Diagnosis not present

## 2021-12-07 DIAGNOSIS — C50811 Malignant neoplasm of overlapping sites of right female breast: Secondary | ICD-10-CM | POA: Diagnosis not present

## 2021-12-07 DIAGNOSIS — Z9189 Other specified personal risk factors, not elsewhere classified: Secondary | ICD-10-CM | POA: Diagnosis not present

## 2021-12-07 DIAGNOSIS — R928 Other abnormal and inconclusive findings on diagnostic imaging of breast: Secondary | ICD-10-CM | POA: Diagnosis not present

## 2021-12-07 DIAGNOSIS — Z1231 Encounter for screening mammogram for malignant neoplasm of breast: Secondary | ICD-10-CM | POA: Diagnosis not present

## 2021-12-07 DIAGNOSIS — Z170 Estrogen receptor positive status [ER+]: Secondary | ICD-10-CM | POA: Diagnosis not present

## 2022-01-12 DIAGNOSIS — E782 Mixed hyperlipidemia: Secondary | ICD-10-CM | POA: Diagnosis not present

## 2022-01-12 DIAGNOSIS — R7309 Other abnormal glucose: Secondary | ICD-10-CM | POA: Diagnosis not present

## 2022-01-12 DIAGNOSIS — Z0000 Encounter for general adult medical examination without abnormal findings: Secondary | ICD-10-CM | POA: Diagnosis not present

## 2022-01-12 DIAGNOSIS — E039 Hypothyroidism, unspecified: Secondary | ICD-10-CM | POA: Diagnosis not present

## 2022-01-14 DIAGNOSIS — Z1231 Encounter for screening mammogram for malignant neoplasm of breast: Secondary | ICD-10-CM | POA: Diagnosis not present

## 2022-01-14 DIAGNOSIS — E782 Mixed hyperlipidemia: Secondary | ICD-10-CM | POA: Diagnosis not present

## 2022-01-14 DIAGNOSIS — Z124 Encounter for screening for malignant neoplasm of cervix: Secondary | ICD-10-CM | POA: Diagnosis not present

## 2022-01-14 DIAGNOSIS — R7303 Prediabetes: Secondary | ICD-10-CM | POA: Diagnosis not present

## 2022-01-14 DIAGNOSIS — Z0000 Encounter for general adult medical examination without abnormal findings: Secondary | ICD-10-CM | POA: Diagnosis not present

## 2022-01-14 DIAGNOSIS — E039 Hypothyroidism, unspecified: Secondary | ICD-10-CM | POA: Diagnosis not present

## 2022-12-03 ENCOUNTER — Encounter: Payer: BLUE CROSS/BLUE SHIELD | Attending: Family | Primary: Family Medicine

## 2022-12-08 ENCOUNTER — Encounter: Admit: 2022-12-08 | Discharge: 2022-12-08 | Payer: BLUE CROSS/BLUE SHIELD | Attending: Family | Primary: Family Medicine

## 2022-12-08 DIAGNOSIS — Z1212 Encounter for screening for malignant neoplasm of rectum: Secondary | ICD-10-CM

## 2022-12-08 LAB — LIPID PANEL
Chol/HDL Ratio: 5.3 — ABNORMAL HIGH (ref 0.0–4.4)
Cholesterol, Total: 267 mg/dL — ABNORMAL HIGH (ref 100–200)
HDL: 50 mg/dL (ref >=50)
LDL Cholesterol: 174.8 mg/dL — ABNORMAL HIGH (ref 0.0–100.0)
LDL/HDL Ratio: 3.5
Triglycerides: 211 mg/dL — ABNORMAL HIGH (ref 0–149)
VLDL: 42.2 mg/dL — ABNORMAL HIGH (ref 5.0–40.0)

## 2022-12-08 LAB — CBC WITH AUTO DIFFERENTIAL
Basophils %: 0.6 % (ref 0.0–2.0)
Basophils Absolute: 0 10*3/uL (ref 0.0–0.2)
Eosinophils %: 1.1 % (ref 0.0–7.0)
Eosinophils Absolute: 0.1 10*3/uL (ref 0.0–0.5)
Hematocrit: 44.2 % (ref 34.0–47.0)
Hemoglobin: 15 g/dL (ref 11.5–15.7)
Immature Grans (Abs): 0.02 10*3/uL (ref 0.00–0.06)
Immature Granulocytes %: 0.4 % (ref 0.0–0.6)
Lymphocytes Absolute: 2.8 10*3/uL (ref 1.0–3.2)
Lymphocytes: 53.4 % — ABNORMAL HIGH (ref 15.0–45.0)
MCH: 29.8 pg (ref 27.0–34.5)
MCHC: 33.9 g/dL (ref 30.0–36.0)
MCV: 87.9 fL (ref 81.0–99.0)
MPV: 12.5 fL — ABNORMAL HIGH (ref 7.0–12.2)
Monocytes %: 5.5 % (ref 4.0–12.0)
Monocytes Absolute: 0.3 10*3/uL (ref 0.3–1.0)
NRBC Absolute: 0 10*3/uL (ref 0.000–0.012)
NRBC Automated: 0 % (ref 0.0–0.2)
Neutrophils %: 39 % — ABNORMAL LOW (ref 42.0–74.0)
Neutrophils Absolute: 2.1 10*3/uL (ref 1.6–7.3)
Platelets: 284 10*3/uL (ref 140–440)
RBC: 5.03 x10e6/mcL (ref 3.60–5.20)
RDW: 12.5 % (ref 10.0–17.0)
WBC: 5.3 10*3/uL (ref 3.8–10.6)

## 2022-12-08 LAB — COMPREHENSIVE METABOLIC PANEL
ALT: 23 U/L (ref 0–35)
AST: 19 U/L (ref 0–35)
Albumin/Globulin Ratio: 2 (ref 1.00–2.70)
Albumin: 4.9 g/dL (ref 3.5–5.2)
Alk Phosphatase: 106 U/L (ref 35–117)
Anion Gap: 12 mmol/L (ref 2–17)
BUN: 18 mg/dL (ref 6–20)
CO2: 25 mmol/L (ref 22–29)
Calcium: 9.9 mg/dL (ref 8.5–10.7)
Chloride: 104 mmol/L (ref 98–107)
Creatinine: 0.9 mg/dL (ref 0.5–1.0)
Est, Glom Filt Rate: 74 mL/min/1.73m (ref >=60)
Globulin: 2.4 g/dL (ref 1.9–4.4)
Glucose: 103 mg/dL — ABNORMAL HIGH (ref 70–99)
Osmolaliy Calculated: 283 mOsm/kg (ref 270–287)
Potassium: 4.2 mmol/L (ref 3.5–5.3)
Sodium: 141 mmol/L (ref 135–145)
Total Bilirubin: 0.29 mg/dL (ref 0.00–1.20)
Total Protein: 7.3 g/dL (ref 5.7–8.3)

## 2022-12-08 LAB — TSH WITH REFLEX: TSH: 0.585 mcIU/mL (ref 0.358–3.740)

## 2022-12-08 LAB — FOLLICLE STIMULATING HORMONE: FSH: 40.9 IU/mL

## 2022-12-08 LAB — TESTOSTERONE: Testosterone: 16.6 ng/dL (ref 2.9–40.8)

## 2022-12-08 LAB — ESTRADIOL: Estradiol: <25 pg/mL

## 2022-12-08 LAB — HEMOGLOBIN A1C
Estimated Avg Glucose: 126
Estimated Avg Glucose: 136
Hemoglobin A1C: 6 % (ref 4.0–6.0)

## 2022-12-08 LAB — PROGESTERONE: Progesterone: <0.64 ng/mL

## 2022-12-08 LAB — LUTEINIZING HORMONE: LH: 25.8 m[IU]/mL — ABNORMAL HIGH (ref 1.14–8.75)

## 2022-12-08 NOTE — Addendum Note (Signed)
Addended by: Donato Heinz A on: 12/08/2022 12:48 PM     Modules accepted: Orders

## 2022-12-08 NOTE — Progress Notes (Addendum)
Ellen Meadows (DOB:  [birth year]) is a 57 y.o. female,New patient, here for evaluation of the following chief complaint(s):  New Patient (Needs orders for mammo, Colon, wants pap today, in menopause)      Assessment & Plan   1. Encounter for colorectal cancer screening  -     Amb External Referral To Gastroenterology  2. Encounter to establish care  -     CBC with Auto Differential; Future  -     Comprehensive Metabolic Panel; Future  -     Lipid Panel; Future  -     TSH with Reflex (CERNER); Future  All current medications, reviewed, interim health history reviewed and discussed and questions answered. All preventative health exams reviewed and discussed.    Precautions were given regarding new or worsening problems, continue any maintenance medications and follow up with PCP.  3. Encounter for screening mammogram for breast cancer  -     MAM TOMO DIGITAL SCREEN BILATERAL (PER PROTOCOL); Future  4. Hypothyroidism, unspecified type  -     TSH with Reflex (CERNER); Future  5. BMI 31.0-31.9,adult  Low carbohydrate nutrition plan was given to the patient and reviewed in detail. Increased activity was recommended, specifically recommended a goal of 10,000 steps daily if this is tolerable. Recommended follow-up in at least the next 6 months to discuss progress and need for further intervention.  6. History of breast cancer in female  7. Prediabetes  -     Hemoglobin A1C; Future  8. NAFLD (nonalcoholic fatty liver disease)  9. Screening exam for skin cancer  -     External Referral To Dermatology  10. Chronic pain of left knee  -     XR KNEE LEFT (3 VIEWS); Future  We discussed tylenol, ibuprofen. Imaging and referrals for PT when she is ready.   11. Right arm pain  We discussed tylenol, ibuprofen. Imaging and referrals for PT when she is ready.   12. Mood swings  -     Estrogens, total; Future  -     Luteinizing Hormone; Future  -     Testosterone; Future  -     Follicle Stimulating Hormone; Future  -     Progesterone;  Future  -     Estradiol; Future  Uncontrolled. We discussed restarting therapy or CBT when ready. Will check hormones per her request.     Return if symptoms worsen or fail to improve.       Subjective   Patient to establish care. Recently moved from [facility]. Pmh of hypothyroid, breast CA, NAFLD. Brings a list of concerns with her today.   She's not been seen by PCP in a couple years.   Mentions concerns with mood swings. She cries easily. She's unsure if hormone related and would like to have this checked today. Recalls being on paxil during her cancer and was helpful. She's been off for about a year now. She declines going back on therapy just yet. Symptoms are not every day. No SI, HI, has a good support system.   Also shares recent issues with left knee pain, right elbow pain. Pain is intermittent. She stopped exercising and symptoms got better. Not taking anything for pain. No redness, swelling or warmth to joints.   She's due for thyroid check today. Has been on current dosing of synthroid for many years. She does mention some dry patches to her skin. No palpitations, no changes to bowel or bladder habits, hair or nails.  She does have history of breast cancer. Was following with [facility] prior, had chemo, radiation, lumpectomy. Was on tamoxifen for 5 years and stopped this last year. She is now just doing yearly MMGs and due for this today.   No other cancer history that she's aware.   She denies any cardiac history- had cardiac work up during cancer including calcium score CT that was zero.   She was shown on Ct to have fatty liver. She's not really watching her diet or exercising. No abdominal pain, blood or tarry stools.   Preventative reviewed and discussed. Due for MMG, PAP, current on cscope.   Also mentions snoring and would like sleep study.         Review of Systems   All other systems reviewed and are negative.         Objective   Physical Exam   General Examination:   ?????? GENERAL APPEARANCE:?in no acute  distress, well developed, well nourished.?   ?????? HEAD:?normocephalic, atraumatic.?   ?????? EYES:?pupils equal, round, reactive to light and accommodation.?   ?????? EARS:?normal.?   ?????? ORAL CAVITY:?mucosa moist.?   ?????? THROAT:?clear.?   ?????? NECK:?neck supple, full range of motion, no lymphadenopathy.?   ?????? LYMPH NODES:?no palpable adenopathy.?   ?????? HEART:?normal rate and regular rhythm, no murmurs, rubs, gallops, or thrills.?   ?????? LUNGS: clear to auscultation bilaterally?   ?????? CHEST:?normal shape and expansion.?   ?????? ABDOMEN:?normal bowel sounds present, soft, nontender, nondistended   ?????? SKIN:?no suspicious lesions, warm and dry.?   ?????? MUSCULOSKELETAL:?no swelling or deformity.?   ?????? BACK:?no costovertebral angle tenderness.?   ?????? EXTREMITIES:?no edema, clubbing or cyanosis.?   ?????? PERIPHERAL PULSES:?palpable in all 4 extremities.?   ?????? NEUROLOGIC:?nonfocal.?   ?????? Most recent available labs reviewed and discussed.            An electronic signature was used to authenticate this note.    --Dorthula Matas, APRN - NP

## 2022-12-11 LAB — ESTROGENS, TOTAL: Estrogen Total: 116 pg/mL (ref 40–244)

## 2023-01-21 ENCOUNTER — Telehealth

## 2023-01-21 NOTE — Telephone Encounter (Signed)
 Patient has questions regarding referrals status for:     Sleep Study   Dermatologist     And none completed or contacted regarding scheduling     Patient requests that dermatology referral is closer to her within : [facility] [place]     States she has scheduled a mammogram via MyChart for the [employer]. [place]- 02/18/2023    Please advise

## 2023-01-21 NOTE — Telephone Encounter (Signed)
 Called  let know resubmitted for different referral and faxed over for sleep study

## 2023-02-10 ENCOUNTER — Telehealth

## 2023-02-10 ENCOUNTER — Encounter

## 2023-02-10 MED ORDER — LEVOTHYROXINE SODIUM 125 MCG PO TABS
125 | ORAL_TABLET | Freq: Every day | ORAL | 1 refills | Status: DC
Start: 2023-02-10 — End: 2023-02-10

## 2023-02-10 NOTE — Telephone Encounter (Signed)
Pt needs a refill on her levothyroxine (SYNTHROID) 125 MCG tablet 1x 90 day     [id number] - [place], [place] - [address] - P [phone] - F [phone]

## 2023-02-11 MED ORDER — LEVOTHYROXINE SODIUM 125 MCG PO TABS
125 MCG | ORAL_TABLET | Freq: Every day | ORAL | 1 refills | Status: DC
Start: 2023-02-11 — End: 2023-04-29

## 2023-02-18 ENCOUNTER — Inpatient Hospital Stay: Admit: 2023-02-18 | Payer: BLUE CROSS/BLUE SHIELD | Primary: Family Medicine

## 2023-02-18 DIAGNOSIS — Z1231 Encounter for screening mammogram for malignant neoplasm of breast: Secondary | ICD-10-CM

## 2023-02-18 NOTE — Telephone Encounter (Signed)
 Patient is calling because she received a bill for her New Patient appt.  12/08/22  Per her insurance it was coded as a diagnostic visit.    Which was not covered by her insurance.  Patient would like to know if it could be reviewed and re coded to be Routine.    Please advise

## 2023-03-18 ENCOUNTER — Encounter: Payer: BLUE CROSS/BLUE SHIELD | Attending: Family | Primary: Family Medicine

## 2023-04-29 ENCOUNTER — Encounter: Admit: 2023-04-29 | Payer: BLUE CROSS/BLUE SHIELD | Admitting: Family | Primary: Family Medicine

## 2023-04-29 VITALS — BP 134/82 | HR 78 | Ht 68.0 in | Wt 200.0 lb

## 2023-04-29 DIAGNOSIS — E7800 Pure hypercholesterolemia, unspecified: Principal | ICD-10-CM

## 2023-04-29 MED ORDER — ROSUVASTATIN CALCIUM 5 MG PO TABS
5 MG | ORAL_TABLET | Freq: Every day | ORAL | 1 refills | Status: DC
Start: 2023-04-29 — End: 2023-10-18

## 2023-04-29 MED ORDER — FLUOXETINE HCL 10 MG PO CAPS
10 MG | ORAL_CAPSULE | Freq: Every day | ORAL | 3 refills | Status: DC
Start: 2023-04-29 — End: 2023-08-19

## 2023-04-29 MED ORDER — LEVOTHYROXINE SODIUM 125 MCG PO TABS
125 MCG | ORAL_TABLET | Freq: Every day | ORAL | 1 refills | Status: DC
Start: 2023-04-29 — End: 2023-10-18

## 2023-04-29 NOTE — Progress Notes (Signed)
 Ellen Meadows (DOB:  January 03, 1966) is a 57 y.o. female,Established patient, here for evaluation of the following chief complaint(s):  Follow-up (With Pap, menopausal no cycle)         Assessment & Plan  Hypothyroidism, unspecified type   Chronic, at goal (

## 2023-04-29 NOTE — Telephone Encounter (Signed)
 Faxed

## 2023-05-08 LAB — PAP IG, APTIMA HPV AND RFX 16/18,45 (199305)
.: 0
HPV Aptima: NEGATIVE

## 2023-05-16 ENCOUNTER — Encounter: Admit: 2023-05-16 | Admitting: Family

## 2023-05-16 DIAGNOSIS — E7800 Pure hypercholesterolemia, unspecified: Secondary | ICD-10-CM

## 2023-06-10 ENCOUNTER — Encounter: Attending: Family | Primary: Family Medicine

## 2023-06-24 ENCOUNTER — Telehealth

## 2023-06-24 NOTE — Telephone Encounter (Signed)
 Ordered notified

## 2023-06-24 NOTE — Telephone Encounter (Signed)
Patient is requesting to have a Thyroid order added to her labs. Please call and advise once order have been submitted.

## 2023-07-08 ENCOUNTER — Encounter: Payer: BLUE CROSS/BLUE SHIELD | Attending: Family | Primary: Family Medicine

## 2023-07-22 ENCOUNTER — Other Ambulatory Visit: Admit: 2023-07-22 | Discharge: 2023-07-22 | Payer: BLUE CROSS/BLUE SHIELD | Primary: Family Medicine

## 2023-07-22 DIAGNOSIS — E039 Hypothyroidism, unspecified: Secondary | ICD-10-CM

## 2023-07-22 LAB — LIPID PANEL
Chol/HDL Ratio: 3.7 (ref 0.0–4.4)
Cholesterol, Total: 182 mg/dL (ref 100–200)
HDL: 49 mg/dL — ABNORMAL LOW (ref >=50)
LDL Cholesterol: 109.2 mg/dL — ABNORMAL HIGH (ref 0.0–100.0)
LDL/HDL Ratio: 2.2
Triglycerides: 119 mg/dL (ref 0–149)
VLDL: 23.8 mg/dL (ref 5.0–40.0)

## 2023-07-22 LAB — T4, FREE: T4 Free: 1.62 ng/dL (ref 0.82–1.70)

## 2023-07-22 LAB — TSH: TSH, 3rd Generation: 0.325 u[IU]/mL — ABNORMAL LOW (ref 0.358–3.740)

## 2023-07-23 LAB — APOLIPOPROTEIN B: Apolipoprotein B: 96 mg/dL — ABNORMAL HIGH (ref <90)

## 2023-07-25 LAB — LIPOPROTEIN A (LPA): Lipoprotein (a): <8.4 nmol/L (ref <75.0)

## 2023-07-29 ENCOUNTER — Ambulatory Visit: Admit: 2023-07-29 | Discharge: 2023-07-29 | Payer: BLUE CROSS/BLUE SHIELD | Attending: Family | Primary: Family Medicine

## 2023-07-29 VITALS — BP 118/72 | HR 82 | Ht 68.0 in | Wt 206.0 lb

## 2023-07-29 DIAGNOSIS — E039 Hypothyroidism, unspecified: Secondary | ICD-10-CM

## 2023-07-29 MED ORDER — TIRZEPATIDE-WEIGHT MANAGEMENT 2.5 MG/0.5ML SC SOAJ
2.5 | SUBCUTANEOUS | 0 refills | Status: AC
Start: 2023-07-29 — End: ?

## 2023-07-29 NOTE — Progress Notes (Signed)
 Ellen Meadows (DOB:  [birth year]) is a 58 y.o. female,Established patient, here for evaluation of the following chief complaint(s):  Follow-up         Assessment & Plan  Hypothyroidism, unspecified type   Chronic, not at goal (unstable), changes made today: will repeat tsh at next visit. Precautions given for increase in palpitations today new or worsening symptoms. She otherwise feels well today and is asymptomatic on exam         High cholesterol   Chronic, at goal (stable), continue current treatment plan         Weight gain   Chronic, not at goal (unstable), changes made today: will follow response to glp1. We discussed alternatives and compounding options if not covered by her insurance. Will also be following strict diet, exercise routine.     Orders:    tirzepatide-weight management (ZEPBOUND) 2.5 MG/0.5ML SOAJ subCUTAneous auto-injector pen; Inject 2.5 mg into the skin every 7 days    Prediabetes            BMI 31.0-31.9,adult      Chronic, not at goal (unstable), changes made today: will follow response to glp1. We discussed alternatives and compounding options if not covered by her insurance. Will also be following strict diet, exercise routine.        Mood swings     Improved will continue medication.       All current medications, reviewed, interim health history reviewed and discussed and questions answered.   Precautions were given regarding new or worsening problems, continue any maintenance medications and follow up with PCP.    The level of risk for this patient is considered moderate, as the visit included prescription drug management.??       Return in about 4 weeks (around 08/26/2023), or if symptoms worsen or fail to improve.       Subjective   07/29/23  Please see below. For follow up on pmh of hypothyroid, HLD, depression, obesity.   Overall doing well.   Mood has improved with prozac. Denies any adverse effects to therapy.   No myalgias on statin. Calcium score was zero.   Thyroid was slightly  off but she reports that she is feeling well. No palpitations no bowel habit changes. Energy is stable.   She does mentions chronic issues with her weight and would like       04/29/23  Please see below. For routine follow up today. Also requesting PAP smear.   Continues to have some mood issues, frequent crying spells. Interested in medication today. Was on paxil prior that caused some side effects with weaning off this.   Other chronic conditions are stable.   She did have high cholesterol on most recent blood work and has strong family history of MI.   She denies any CP, Sob or Le edema. No headaches or dizziness.   Preventative reviewed and discussed.              Review of Systems   All other systems reviewed and are negative.         Objective   Physical Exam   GENERAL APPEARANCE: well developed, well nourished, in no acute distress.   HEAD: normocephalic, atraumatic.   EYES: Pupils equal and round, conjuctiva clear, lids normal.   EARS: Externally grossly normal. Hearing grossly intact.   NOSE: no external lesions.   NECK: neck supple, normal active ROM exhibited.   LYMPH NODES: no palpable adenopathy.   LUNGS: Breathing comfortably,  no audible wheezes on expiration. Clear  CARDIAC: RRR  CHEST: normal movement  EXTREMITIES: Appears well perfused.Marland Kitchen   NEUROLOGIC: CN grossly intact., cognitive exam grossly normal.   Previous results reviewed and discussed.            An electronic signature was used to authenticate this note.    --Dorthula Matas, APRN - NP

## 2023-08-01 NOTE — Telephone Encounter (Signed)
 error

## 2023-08-03 NOTE — Telephone Encounter (Signed)
 PA Denied for the [employer]

## 2023-08-19 ENCOUNTER — Encounter

## 2023-08-19 MED ORDER — FLUOXETINE HCL 10 MG PO CAPS
10 | ORAL_CAPSULE | Freq: Every day | ORAL | 3 refills | Status: DC
Start: 2023-08-19 — End: 2023-11-25

## 2023-08-26 ENCOUNTER — Telehealth

## 2023-08-26 NOTE — Telephone Encounter (Signed)
 Had to reschedule and ordered lab for appt

## 2023-09-14 ENCOUNTER — Other Ambulatory Visit: Admit: 2023-09-14 | Discharge: 2023-09-14 | Payer: BLUE CROSS/BLUE SHIELD | Primary: Family Medicine

## 2023-09-14 DIAGNOSIS — E039 Hypothyroidism, unspecified: Secondary | ICD-10-CM

## 2023-09-14 LAB — T4, FREE: T4 Free: 1.59 ng/dL (ref 0.82–1.70)

## 2023-09-14 LAB — TSH: TSH, 3rd Generation: 0.495 u[IU]/mL (ref 0.358–3.740)

## 2023-09-16 ENCOUNTER — Ambulatory Visit: Admit: 2023-09-16 | Discharge: 2023-09-16 | Payer: BLUE CROSS/BLUE SHIELD | Attending: Family | Primary: Family Medicine

## 2023-09-16 VITALS — BP 110/62 | HR 86 | Ht 68.0 in | Wt 211.0 lb

## 2023-09-16 DIAGNOSIS — R0683 Snoring: Secondary | ICD-10-CM

## 2023-09-16 MED ORDER — PHENTERMINE HCL 37.5 MG PO CAPS
37.5 | ORAL_CAPSULE | Freq: Every morning | ORAL | 0 refills | 30.00000 days | Status: DC
Start: 2023-09-16 — End: 2023-10-16

## 2023-09-16 MED ORDER — TIRZEPATIDE-WEIGHT MANAGEMENT 2.5 MG/0.5ML SC SOAJ
2.5 | SUBCUTANEOUS | 0 refills | 28.00000 days | Status: DC
Start: 2023-09-16 — End: 2023-09-16

## 2023-09-16 NOTE — Progress Notes (Signed)
 Ellen Meadows (DOB:  22-Mar-1966) is a 58 y.o. female,Established patient, here for evaluation of the following chief complaint(s):  Follow-up         Assessment & Plan  Weight gain      Chronic, not at goal (unstable), changes made today: will follow response to phentermine. Also encouraged stricter lifestyle with low carb diet, routine exercise and resistance training.        Snoring       Orders:    Home Sleep Study; Future    Other fatigue     Increasing exercise may be helpful. Will send for sleep study.   Orders:    Home Sleep Study; Future    BMI 32.0-32.9,adult   Chronic, not at goal (unstable), changes made today: will follow response to phentermine. Also encouraged stricter lifestyle with low carb diet, routine exercise and resistance training.     Orders:    phentermine 37.5 MG capsule; Take 1 capsule by mouth every morning for 30 days. Max Daily Amount: 37.5 mg    Hypothyroidism, unspecified type     Chronic, stable, current medications reviewed and discussed, continued (moderate risk).    Orders:    TSH reflex to FT4 (CERNER); Future    High cholesterol     Chronic, stable, current medications reviewed and discussed, continued (moderate risk).        All current medications, reviewed, interim health history reviewed and discussed and questions answered.   Precautions were given regarding new or worsening problems, continue any maintenance medications and follow up with PCP.    The level of risk for this patient is considered moderate, as the visit included prescription drug management. During this visit, 3 items were reviewed and/or ordered.     Return in 6 weeks (on 10/28/2023), or if symptoms worsen or fail to improve.       Subjective   09/16/23  For weight management and follow up today.   Doing well overall. She never got zepbound  filled due to insurance coverage. Still interested in options for weight loss.   She had thyroid labs repeated and normal.   She is still fatigued, she admits to snoring at  night. She is not exercising.       07/29/23  Please see below. For follow up on pmh of hypothyroid, HLD, depression, obesity.   Overall doing well.   Mood has improved with prozac . Denies any adverse effects to therapy.   No myalgias on statin. Calcium  score was zero.   Thyroid was slightly off but she reports that she is feeling well. No palpitations no bowel habit changes. Energy is stable.   She does mentions chronic issues with her weight and would like              Review of Systems   All other systems reviewed and are negative.         Objective   Physical Exam     GENERAL APPEARANCE: well developed, well nourished, in no acute distress.   HEAD: normocephalic, atraumatic.   EYES: Pupils equal and round, conjuctiva clear, lids normal.   EARS: Externally grossly normal. Hearing grossly intact.   NOSE: no external lesions.   NECK: neck supple, normal active ROM exhibited.   LYMPH NODES: no palpable adenopathy.   LUNGS: Breathing comfortably, no audible wheezes on expiration.   CHEST: normal movement  EXTREMITIES: Appears well perfused.Aaron Aas   NEUROLOGIC: CN grossly intact., cognitive exam grossly normal.   Previous results reviewed and  discussed.          An electronic signature was used to authenticate this note.    --Ruperto Coup, APRN - NP

## 2023-10-16 ENCOUNTER — Encounter

## 2023-10-18 ENCOUNTER — Encounter

## 2023-10-18 MED ORDER — LEVOTHYROXINE SODIUM 125 MCG PO TABS
125 | ORAL_TABLET | Freq: Every day | ORAL | 1 refills | 90.00000 days | Status: AC
Start: 2023-10-18 — End: ?

## 2023-10-18 MED ORDER — ROSUVASTATIN CALCIUM 5 MG PO TABS
5 | ORAL_TABLET | Freq: Every day | ORAL | 1 refills | 90.00000 days | Status: AC
Start: 2023-10-18 — End: ?

## 2023-10-18 MED ORDER — PHENTERMINE HCL 37.5 MG PO CAPS
37.5 | ORAL_CAPSULE | Freq: Every morning | ORAL | 0 refills | 30.00000 days | Status: DC
Start: 2023-10-18 — End: 2023-10-23

## 2023-10-28 ENCOUNTER — Encounter

## 2023-10-28 ENCOUNTER — Encounter: Attending: Family | Primary: Family Medicine

## 2023-11-25 ENCOUNTER — Encounter

## 2023-11-26 MED ORDER — FLUOXETINE HCL 10 MG PO CAPS
10 | ORAL_CAPSULE | Freq: Every day | ORAL | 0 refills | Status: AC
Start: 2023-11-26 — End: ?

## 2024-01-20 ENCOUNTER — Encounter

## 2024-01-24 ENCOUNTER — Telehealth: Admit: 2024-01-24 | Discharge: 2024-01-24 | Payer: BLUE CROSS/BLUE SHIELD | Attending: Family | Primary: Family Medicine

## 2024-01-24 DIAGNOSIS — E039 Hypothyroidism, unspecified: Principal | ICD-10-CM

## 2024-01-24 MED ORDER — ROSUVASTATIN CALCIUM 5 MG PO TABS
5 | ORAL_TABLET | Freq: Every day | ORAL | 1 refills | Status: AC
Start: 2024-01-24 — End: ?

## 2024-01-24 MED ORDER — FLUOXETINE HCL 10 MG PO CAPS
10 | ORAL_CAPSULE | Freq: Every day | ORAL | 1 refills | Status: AC
Start: 2024-01-24 — End: ?

## 2024-01-24 MED ORDER — LEVOTHYROXINE SODIUM 125 MCG PO TABS
125 | ORAL_TABLET | Freq: Every day | ORAL | 1 refills | Status: AC
Start: 2024-01-24 — End: ?

## 2024-01-24 NOTE — Progress Notes (Signed)
 Ellen Meadows, was evaluated through a synchronous (real-time) audio-video encounter. The patient (or guardian if applicable) is aware that this is a billable service, which includes applicable co-pays. This Virtual Visit was conducted with patient's (and/or legal guardian's) consent. Patient identification was verified, and a caregiver was present when appropriate.   The patient was located at Home: [address]  [place] [address]  Provider was located at Home (Appt Dept State): [place]  Confirm you are appropriately licensed, registered, or certified to deliver care in the state where the patient is located as indicated above. If you are not or unsure, please re-schedule the visit: Yes, I confirm.     Ellen Meadows (DOB:  March 06, 1966) is a Established patient, presenting virtually for evaluation of the following:  Chief Complaint   Patient presents with    Follow-up    Medication Refill         Below is the assessment and plan developed based on review of pertinent history, physical exam, labs, studies, and medications.     Assessment & Plan  Hypothyroidism, unspecified type     Chronic, stable, current medications reviewed and discussed, continued (moderate risk).    Orders:    TSH reflex to FT4 (CERNER); Future    levothyroxine  (SYNTHROID ) 125 MCG tablet; Take 1 tablet by mouth Daily    BMI 32.0-32.9,adult       Orders:    CBC with Auto Differential; Future    Comprehensive Metabolic Panel; Future    TSH reflex to FT4 (CERNER); Future    Prediabetes       Orders:    Hemoglobin A1C; Future    High cholesterol     Chronic, stable, current medications reviewed and discussed, continued (moderate risk).    Orders:    Lipid Panel; Future    rosuvastatin  (CRESTOR ) 5 MG tablet; Take 1 tablet by mouth daily    Mood swings     Chronic, stable, current medications reviewed and discussed, continued (moderate risk).    Orders:    FLUoxetine  (PROZAC ) 10 MG capsule; Take 1 capsule by mouth daily    All current medications,  reviewed, interim health history reviewed and discussed and questions answered. All preventative health exams reviewed and discussed.    Precautions were given regarding new or worsening problems, continue any maintenance medications and follow up with PCP.    Encounter for screening mammogram for malignant neoplasm of breast       Orders:    MAM TOMO DIGITAL SCREEN BILATERAL (PER PROTOCOL); Future    The level of risk for this patient is considered moderate, as the visit included prescription drug management. During this visit, 3 items were reviewed and/or ordered.        Return in about 6 months (around 07/23/2024), or if symptoms worsen or fail to improve, for  CPE and follow up.       Subjective   Patient for routine follow up and prescriptions refills. Denies any concerns today.   Stopped phentermine  due to side effects, did not like the way it made her feel. Doing weight loss on her own.   Denies any issues with her thyroid or cholesterol medications. Labs reviewed and discussed.   Due for MMG this month and reminded of this      Review of Systems   All other systems reviewed and are negative.         Objective   Patient-Reported Vitals  No data recorded     Physical  Exam  GENERAL APPEARANCE: well developed, well nourished, in no acute distress.   HEAD: normocephalic, atraumatic.   EYES: Pupils equal and round, conjuctiva clear, lids normal.   EARS: Externally grossly normal. Hearing grossly intact.   NOSE: no external lesions.   NECK: neck supple, normal active ROM exhibited.   LYMPH NODES: no palpable adenopathy.   LUNGS: Breathing comfortably, no audible wheezes on expiration.   CHEST: normal movement  EXTREMITIES: Appears well perfused.SABRA   NEUROLOGIC: CN grossly intact., cognitive exam grossly normal.   Previous results reviewed and discussed.               --Jolan DELENA Pleasant, APRN - NP

## 2024-02-07 ENCOUNTER — Inpatient Hospital Stay
Admit: 2024-02-07 | Discharge: 2024-02-07 | Disposition: A | Discharge status: Home / Self Care | Payer: BLUE CROSS/BLUE SHIELD | Arrived: WI | Attending: Emergency Medicine

## 2024-02-07 ENCOUNTER — Emergency Department: Admit: 2024-02-07 | Payer: BLUE CROSS/BLUE SHIELD | Primary: Family Medicine

## 2024-02-07 DIAGNOSIS — K5732 Diverticulitis of large intestine without perforation or abscess without bleeding: Principal | ICD-10-CM

## 2024-02-07 LAB — CBC WITH AUTO DIFFERENTIAL
Basophils %: 0.1 % (ref 0.0–2.0)
Basophils Absolute: 0 x10e3/mcL (ref 0.0–0.2)
Eosinophils %: 0.3 % (ref 0.0–7.0)
Eosinophils Absolute: 0 x10e3/mcL (ref 0.0–0.5)
Hematocrit: 36.2 % (ref 34.0–47.0)
Hemoglobin: 11.9 g/dL (ref 11.5–15.7)
Immature Grans (Abs): 0.02 x10e3/mcL (ref 0.00–0.06)
Immature Granulocytes %: 0.2 % (ref 0.0–0.6)
Lymphocytes Absolute: 2.4 x10e3/mcL (ref 1.0–3.2)
Lymphocytes: 26.9 % (ref 15.0–45.0)
MCH: 28.7 pg (ref 27.0–34.5)
MCHC: 32.9 g/dL (ref 30.0–36.0)
MCV: 87.4 fL (ref 81.0–99.0)
MPV: 11.1 fL (ref 7.0–12.2)
Monocytes %: 5.9 % (ref 4.0–12.0)
Monocytes Absolute: 0.5 x10e3/mcL (ref 0.3–1.0)
Neutrophils %: 66.6 % (ref 42.0–74.0)
Neutrophils Absolute: 5.9 x10e3/mcL (ref 1.6–7.3)
Platelets: 263 x10e3/mcL (ref 140–440)
RBC: 4.14 x10e6/mcL (ref 3.60–5.20)
RDW: 12.2 % (ref 10.0–17.0)
WBC: 8.9 x10e3/mcL (ref 3.8–10.6)

## 2024-02-07 LAB — COMPREHENSIVE METABOLIC PANEL
ALT: 12 U/L (ref 0–42)
AST: 13 U/L (ref 0–46)
Albumin/Globulin Ratio: 1.43 (ref 1.00–2.70)
Albumin: 4 g/dL (ref 3.5–5.2)
Alk Phosphatase: 83 U/L (ref 35–117)
Anion Gap: 11 mmol/L (ref 2–17)
BUN: 11 mg/dL (ref 8–23)
CALCIUM,CORRECTED,CCA: 9.2 mg/dL (ref 8.5–10.7)
CO2: 25 mmol/L (ref 22–29)
Calcium: 9.2 mg/dL (ref 8.5–10.7)
Chloride: 103 mmol/L (ref 98–107)
Creatinine: 0.8 mg/dL (ref 0.5–1.0)
Est, Glom Filt Rate: 84 mL/min/1.73m (ref >=60)
Globulin: 2.8 g/dL (ref 1.9–4.4)
Glucose: 149 mg/dL — ABNORMAL HIGH (ref 70–99)
Osmolaliy Calculated: 279 mosm/kg (ref 270–287)
Potassium: 4.4 mmol/L (ref 3.5–5.3)
Sodium: 139 mmol/L (ref 135–145)
Total Bilirubin: 0.44 mg/dL (ref 0.00–1.20)
Total Protein: 6.8 g/dL (ref 5.7–8.3)

## 2024-02-07 LAB — MICROSCOPIC URINALYSIS
BACTERIA, URINE: NONE SEEN
MUCUS, URINE: NONE SEEN /LPF
WBC, UA: NONE SEEN /HPF (ref 0–2)

## 2024-02-07 LAB — URINALYSIS W/ RFLX MICROSCOPIC
Bilirubin, Urine: NEGATIVE
Glucose, Ur: NEGATIVE
Ketones, Urine: NEGATIVE
Leukocyte Esterase, Urine: NEGATIVE
Nitrite, Urine: NEGATIVE
Protein, UA: NEGATIVE
Specific Gravity, UA: 1.01 (ref 1.003–1.035)
Urobilinogen, Urine: 0.2 EU/dL (ref 0.2–1.0)
pH, Urine: 6 (ref 4.5–8.0)

## 2024-02-07 MED ORDER — CETIRIZINE HCL 10 MG PO TABS
10 | Freq: Once | ORAL | Status: AC
Start: 2024-02-07 — End: 2024-02-07
  Administered 2024-02-07: 21:00:00 10 mg via ORAL

## 2024-02-07 MED ORDER — KETOROLAC TROMETHAMINE 15 MG/ML IJ SOLN
15 | Freq: Once | INTRAMUSCULAR | Status: DC
Start: 2024-02-07 — End: 2024-02-07

## 2024-02-07 MED ORDER — METRONIDAZOLE 500 MG PO TABS
500 | ORAL_TABLET | Freq: Three times a day (TID) | ORAL | 0 refills | 7.00000 days | Status: AC
Start: 2024-02-07 — End: 2024-02-17

## 2024-02-07 MED ORDER — SODIUM CHLORIDE 0.9 % IV BOLUS
0.9 | Freq: Once | INTRAVENOUS | Status: AC
Start: 2024-02-07 — End: 2024-02-07
  Administered 2024-02-07: 19:00:00 1000 mL via INTRAVENOUS

## 2024-02-07 MED ORDER — IBUPROFEN 600 MG PO TABS
600 | ORAL_TABLET | Freq: Three times a day (TID) | ORAL | 0 refills | 15.00000 days | Status: AC | PRN
Start: 2024-02-07 — End: ?

## 2024-02-07 MED ORDER — METRONIDAZOLE 250 MG PO TABS
250 | Freq: Once | ORAL | Status: AC
Start: 2024-02-07 — End: 2024-02-07
  Administered 2024-02-07: 20:00:00 500 mg via ORAL

## 2024-02-07 MED ORDER — ONDANSETRON 4 MG PO TBDP
4 | ORAL_TABLET | Freq: Three times a day (TID) | ORAL | 0 refills | 7.00000 days | Status: AC | PRN
Start: 2024-02-07 — End: ?

## 2024-02-07 MED ORDER — IOPAMIDOL 61 % IV SOLN
61 | Freq: Once | INTRAVENOUS | Status: AC | PRN
Start: 2024-02-07 — End: 2024-02-07
  Administered 2024-02-07: 19:00:00 100 mL via INTRAVENOUS

## 2024-02-07 MED ORDER — CIPROFLOXACIN IN D5W 400 MG/200ML IV SOLN
400 | Freq: Once | INTRAVENOUS | Status: AC
Start: 2024-02-07 — End: 2024-02-07
  Administered 2024-02-07: 20:00:00 400 mg via INTRAVENOUS

## 2024-02-07 MED ORDER — SULFAMETHOXAZOLE-TRIMETHOPRIM 800-160 MG PO TABS
800-160 | ORAL_TABLET | Freq: Two times a day (BID) | ORAL | 0 refills | 7.00000 days | Status: AC
Start: 2024-02-07 — End: 2024-02-17

## 2024-02-07 MED FILL — METRONIDAZOLE 250 MG PO TABS: 250 mg | ORAL | Qty: 2 | Fill #0

## 2024-02-07 MED FILL — CETIRIZINE HCL 10 MG PO TABS: 10 mg | ORAL | Qty: 1 | Fill #0

## 2024-02-07 MED FILL — CIPROFLOXACIN IN D5W 400 MG/200ML IV SOLN: 400 MG/200ML | INTRAVENOUS | Qty: 200 | Fill #0

## 2024-02-07 NOTE — ED Provider Notes (Addendum)
 [facility] EMERGENCY DEPARTMENT  EMERGENCY DEPARTMENT ENCOUNTER      Pt Name: Ellen Meadows  MRN: [id number]  Birthdate [birth year]  Date of evaluation: 02/07/2024  Provider: Nash Earnie Sick, DO    CHIEF COMPLAINT       Chief Complaint   Patient presents with    Abdominal Pain     L lower abd pain that has come and gone. Ongoing since Monday. Sts feels like contractions, and painful to touch. Sts normal bm, denies n/v/dysuria. Referred pain to mid upper back.          HISTORY OF PRESENT ILLNESS    Patient is a 58 year old female presenting to the emergency department for abdominal pain.  Reports left lower quadrant abdominal pain.  She states intermittent and sharp in the left lower quadrant.  She states that began over the past 2 days.  Happened last month as well.  Denies any nausea or vomiting.  Reports a contraction-like sensation.    The history is provided by the patient.       Nursing Notes were reviewed.    REVIEW OF SYSTEMS       Review of Systems   All other systems reviewed and are negative.      Except as noted above the remainder of the review of systems was reviewed and negative.       PAST MEDICAL HISTORY     Past Medical History:   Diagnosis Date    Breast cancer [facility]) April 2019    Cancer [facility]) 2018    History of therapeutic radiation April2019    Urinary incontinence     Fixed at [facility] 2018       SURGICAL HISTORY       Past Surgical History:   Procedure Laterality Date    BREAST LUMPECTOMY Right 2019    TONSILLECTOMY         CURRENT MEDICATIONS       Discharge Medication List as of 02/07/2024  5:58 PM        CONTINUE these medications which have NOT CHANGED    Details   rosuvastatin  (CRESTOR ) 5 MG tablet Take 1 tablet by mouth daily, Disp-90 tablet, R-1Normal      levothyroxine  (SYNTHROID ) 125 MCG tablet Take 1 tablet by mouth Daily, Disp-90 tablet, R-1Updated rx- last one sent for # 9 in errorNormal      FLUoxetine  (PROZAC ) 10 MG capsule Take 1 capsule by mouth daily,  Disp-90 capsule, R-1Will send refills at appt once scheduled and kept.Normal             ALLERGIES     Ciprofloxacin , Penicillins, and Sulfa  antibiotics    FAMILY HISTORY       Family History   Problem Relation Age of Onset    Breast Cancer Mother 73        Pelish    Early Death Brother         Heart attack    Heart Attack Brother     High Cholesterol Brother     Diabetes Maternal Grandmother     Breast Cancer Maternal Aunt         Teo of mothers sister    Cancer Maternal Aunt         Ovarian        SOCIAL HISTORY       Social History     Socioeconomic History    Marital status: Married   Tobacco Use  Smoking status: Former     Current packs/day: 0.00     Average packs/day: 1 pack/day for 10.0 years (10.0 ttl pk-yrs)     Types: Cigarettes     Quit date: 2015     Years since quitting: 10.7    Smokeless tobacco: Never   Vaping Use    Vaping status: Never Used   Substance and Sexual Activity    Alcohol use: Not Currently    Drug use: Never    Sexual activity: Yes     Partners: Male       SCREENINGS         Glasgow Coma Scale  Eye Opening: Spontaneous  Best Verbal Response: Oriented  Best Motor Response: Obeys commands  Glasgow Coma Scale Score: 15                     CIWA Assessment  BP: (!) 147/78  Pulse: 81                 PHYSICAL EXAM    (up to 7 for level 4, 8 or more for level 5)     ED Triage Vitals [02/07/24 1435]   BP Girls Systolic BP Percentile Girls Diastolic BP Percentile Boys Systolic BP Percentile Boys Diastolic BP Percentile Temp Temp src Pulse   (!) 161/79 -- -- -- -- 98.2 F (36.8 C) -- 90      Respirations SpO2 Height Weight - Scale       18 97 % 1.727 m (5' 8) 90.7 kg (200 lb)           Physical Exam  Vitals and nursing note reviewed.   Constitutional:       Appearance: Normal appearance.   HENT:      Head: Normocephalic and atraumatic.      Right Ear: External ear normal.      Left Ear: External ear normal.      Nose: Nose normal.      Mouth/Throat:      Mouth: Mucous membranes are moist.    Eyes:      Extraocular Movements: Extraocular movements intact.   Cardiovascular:      Rate and Rhythm: Normal rate and regular rhythm.      Pulses: Normal pulses.   Pulmonary:      Effort: Pulmonary effort is normal.      Breath sounds: Normal breath sounds.   Abdominal:      General: Abdomen is flat.      Tenderness: There is abdominal tenderness in the left lower quadrant.   Musculoskeletal:         General: Normal range of motion.      Cervical back: Neck supple.   Skin:     General: Skin is dry.      Capillary Refill: Capillary refill takes less than 2 seconds.      Findings: No rash.   Neurological:      General: No focal deficit present.      Mental Status: She is alert and oriented to person, place, and time.   Psychiatric:         Mood and Affect: Mood normal.         Behavior: Behavior normal.         DIAGNOSTIC RESULTS     EKG: All EKG's are interpreted by the Emergency Department Physician who either signs or Co-signs this chart in the absence of a cardiologist.    RADIOLOGY:  Non-plain film images such as CT, Ultrasound and MRI are read by the radiologist. Plain radiographic images are visualized and preliminarily interpreted by the emergency physician with the below findings:    Interpretation per the Radiologist below, if available at the time of this note:    CT ABDOMEN PELVIS W IV CONTRAST Additional Contrast? None   Final Result   1. Acute diverticulitis along the junction of the descending and sigmoid colon    with moderate surrounding pericolonic inflammation. No evidence of pericolonic    abscess or perforation.      2. Hepatic steatosis.      3. Small uterine leiomyoma.            ED BEDSIDE ULTRASOUND:   Performed by ED Physician - none    LABS:  Labs Reviewed   COMPREHENSIVE METABOLIC PANEL - Abnormal; Notable for the following components:       Result Value    Glucose 149 (*)     All other components within normal limits   URINALYSIS W/ RFLX MICROSCOPIC - Abnormal; Notable for the  following components:    Blood, Urine Small (*)     All other components within normal limits   CBC WITH AUTO DIFFERENTIAL   MICROSCOPIC URINALYSIS    Narrative:     Added on by Discern Rule       All other labs were within normal range or not returned as of this dictation.    EMERGENCY DEPARTMENT COURSE/REASSESSMENT and MDM:   Vitals:    Vitals:    02/07/24 1435 02/07/24 1600 02/07/24 1700   BP: (!) 161/79 (!) 141/70 (!) 147/78   Pulse: 90 85 81   Resp: 18     Temp: 98.2 F (36.8 C)     SpO2: 97% 99% 99%   Weight: 90.7 kg (200 lb)     Height: 1.727 m (5' 8)         ED Course:    ED Course as of 02/07/24 1847   Tue Feb 07, 2024   1703 Patient given Flagyl  and tolerated this.    About halfway through her IV Cipro  I noted a localized urticarial rash in the medial aspect of her arm.  She states it was itchy earlier but there is no rash at that time.  We will stop Cipro . [MB]   1755 Patient rash cleared we will hold off on any Cipro .  She states she was told when she was young child that she had a penicillin and sulfa  allergy but she does not recall any reaction or having been exposed to either of these medications.  We will do a trial of Bactrim  for home along with Flagyl .  Will hold off on penicillins at this point.  She will need to follow-up with her providers at [facility].  Discussed signs of allergic reaction and strict return precautions patient is agreeable to trying this. [MB]   1757 I reviewed all findings with the patient and/or family at the bedside. Discussed all incidental findings, discussed plan for follow up with PCP/specialist, Advised to return if any changes, worsening, or any other concerns at any time. Pt/family aware and in agreement with the plan of treatment, all questions answered.    [MB]      ED Course User Index  [MB] Leora Nash Jansky, DO       MDM      CONSULTS:  None    PROCEDURES:  Unless otherwise noted below, none  Procedures    FINAL IMPRESSION      1. Diverticulitis of  colon          DISPOSITION/PLAN   DISPOSITION Decision To Discharge 02/07/2024 05:56:35 PM   DISPOSITION CONDITION Stable           PATIENT REFERRED TO:  Larwance Greig BROCKS, DO  [address]  [address]  [place] [address]  [phone]          [facility] [address]  [place]  [address]  [phone]          DISCHARGE MEDICATIONS:  Discharge Medication List as of 02/07/2024  5:58 PM        START taking these medications    Details   sulfamethoxazole -trimethoprim  (BACTRIM  DS) 800-160 MG per tablet Take 1 tablet by mouth 2 times daily for 10 days, Disp-20 tablet, R-0Normal      metroNIDAZOLE  (FLAGYL ) 500 MG tablet Take 1 tablet by mouth 3 times daily for 10 days, Disp-30 tablet, R-0Normal      ondansetron  (ZOFRAN -ODT) 4 MG disintegrating tablet Take 1 tablet by mouth every 8 hours as needed for Nausea or Vomiting, Disp-10 tablet, R-0Normal      ibuprofen  (ADVIL ;MOTRIN ) 600 MG tablet Take 1 tablet by mouth 3 times daily as needed for Pain, Disp-30 tablet, R-0Normal           Controlled Substances Monitoring:          No data to display                (Please note that portions of this note were completed with a voice recognition program.  Efforts were made to edit the dictations but occasionally words are mis-transcribed.)    Nash Earnie Sick, DO (electronically signed)  Attending Emergency Physician            Sick Nash Earnie, DO  02/07/24 1758       Sick Nash Earnie, DO  02/07/24 AMOS

## 2024-03-02 ENCOUNTER — Inpatient Hospital Stay: Admit: 2024-03-02 | Payer: BLUE CROSS/BLUE SHIELD | Primary: Family Medicine

## 2024-03-02 DIAGNOSIS — Z1231 Encounter for screening mammogram for malignant neoplasm of breast: Secondary | ICD-10-CM
